# Patient Record
Sex: Female | Born: 1985 | Race: White | Hispanic: No | Marital: Married | Smoking: Never smoker
Health system: Southern US, Community
[De-identification: ages and names within clinical notes are randomized; demographics above are authoritative.]

## PROBLEM LIST (undated history)

## (undated) DIAGNOSIS — E119 Type 2 diabetes mellitus without complications: Secondary | ICD-10-CM

---

## 2008-11-23 ENCOUNTER — Ambulatory Visit: Payer: Self-pay | Admitting: Endocrinology

## 2008-11-23 DIAGNOSIS — J45909 Unspecified asthma, uncomplicated: Secondary | ICD-10-CM | POA: Insufficient documentation

## 2008-11-23 DIAGNOSIS — E039 Hypothyroidism, unspecified: Secondary | ICD-10-CM | POA: Insufficient documentation

## 2008-11-23 DIAGNOSIS — R011 Cardiac murmur, unspecified: Secondary | ICD-10-CM

## 2008-11-23 DIAGNOSIS — E119 Type 2 diabetes mellitus without complications: Secondary | ICD-10-CM

## 2008-11-23 LAB — CONVERTED CEMR LAB: TSH: 29.25 microintl units/mL — ABNORMAL HIGH (ref 0.35–5.50)

## 2009-02-20 ENCOUNTER — Telehealth: Payer: Self-pay | Admitting: Endocrinology

## 2009-02-22 ENCOUNTER — Ambulatory Visit: Payer: Self-pay | Admitting: Endocrinology

## 2009-04-13 ENCOUNTER — Telehealth: Payer: Self-pay | Admitting: Endocrinology

## 2009-11-28 ENCOUNTER — Ambulatory Visit (HOSPITAL_COMMUNITY): Admission: RE | Admit: 2009-11-28 | Discharge: 2009-11-28 | Payer: Self-pay | Admitting: Obstetrics and Gynecology

## 2012-03-20 ENCOUNTER — Telehealth: Payer: Self-pay

## 2017-02-19 NOTE — Telephone Encounter (Signed)
done

## 2021-05-15 ENCOUNTER — Emergency Department (HOSPITAL_COMMUNITY): Payer: Medicaid Other

## 2021-05-15 ENCOUNTER — Other Ambulatory Visit: Payer: Self-pay

## 2021-05-15 ENCOUNTER — Inpatient Hospital Stay (HOSPITAL_COMMUNITY)
Admission: EM | Admit: 2021-05-15 | Discharge: 2021-05-20 | DRG: 493 | Disposition: A | Discharge status: Home Health | Payer: Medicaid Other | Source: Ambulatory Visit | Attending: Family Medicine | Admitting: Family Medicine

## 2021-05-15 ENCOUNTER — Encounter (HOSPITAL_COMMUNITY): Payer: Self-pay | Admitting: Emergency Medicine

## 2021-05-15 DIAGNOSIS — W1830XA Fall on same level, unspecified, initial encounter: Secondary | ICD-10-CM | POA: Diagnosis present

## 2021-05-15 DIAGNOSIS — S82851A Displaced trimalleolar fracture of right lower leg, initial encounter for closed fracture: Principal | ICD-10-CM | POA: Diagnosis present

## 2021-05-15 DIAGNOSIS — Z20822 Contact with and (suspected) exposure to covid-19: Secondary | ICD-10-CM | POA: Diagnosis present

## 2021-05-15 DIAGNOSIS — Z419 Encounter for procedure for purposes other than remedying health state, unspecified: Secondary | ICD-10-CM

## 2021-05-15 DIAGNOSIS — Z794 Long term (current) use of insulin: Secondary | ICD-10-CM

## 2021-05-15 DIAGNOSIS — S82853A Displaced trimalleolar fracture of unspecified lower leg, initial encounter for closed fracture: Secondary | ICD-10-CM | POA: Diagnosis present

## 2021-05-15 DIAGNOSIS — Z6837 Body mass index (BMI) 37.0-37.9, adult: Secondary | ICD-10-CM

## 2021-05-15 DIAGNOSIS — E1069 Type 1 diabetes mellitus with other specified complication: Secondary | ICD-10-CM

## 2021-05-15 DIAGNOSIS — E1065 Type 1 diabetes mellitus with hyperglycemia: Secondary | ICD-10-CM | POA: Diagnosis present

## 2021-05-15 DIAGNOSIS — E871 Hypo-osmolality and hyponatremia: Secondary | ICD-10-CM

## 2021-05-15 DIAGNOSIS — E039 Hypothyroidism, unspecified: Secondary | ICD-10-CM | POA: Diagnosis present

## 2021-05-15 DIAGNOSIS — J45909 Unspecified asthma, uncomplicated: Secondary | ICD-10-CM | POA: Diagnosis present

## 2021-05-15 DIAGNOSIS — E669 Obesity, unspecified: Secondary | ICD-10-CM | POA: Diagnosis present

## 2021-05-15 DIAGNOSIS — M24071 Loose body in right ankle: Secondary | ICD-10-CM | POA: Diagnosis present

## 2021-05-15 HISTORY — DX: Type 2 diabetes mellitus without complications: E11.9

## 2021-05-15 LAB — CBC
HCT: 38.2 % (ref 36.0–46.0)
Hemoglobin: 12.2 g/dL (ref 12.0–15.0)
MCH: 32 pg (ref 26.0–34.0)
MCHC: 31.9 g/dL (ref 30.0–36.0)
MCV: 100.3 fL — ABNORMAL HIGH (ref 80.0–100.0)
Platelets: 452 10*3/uL — ABNORMAL HIGH (ref 150–400)
RBC: 3.81 MIL/uL — ABNORMAL LOW (ref 3.87–5.11)
RDW: 12.3 % (ref 11.5–15.5)
WBC: 11.6 10*3/uL — ABNORMAL HIGH (ref 4.0–10.5)
nRBC: 0 % (ref 0.0–0.2)

## 2021-05-15 LAB — BASIC METABOLIC PANEL
Anion gap: 10 (ref 5–15)
BUN: 14 mg/dL (ref 6–20)
CO2: 23 mmol/L (ref 22–32)
Calcium: 8.5 mg/dL — ABNORMAL LOW (ref 8.9–10.3)
Chloride: 101 mmol/L (ref 98–111)
Creatinine, Ser: 0.75 mg/dL (ref 0.44–1.00)
GFR, Estimated: >60 mL/min (ref >60)
Glucose, Bld: 258 mg/dL — ABNORMAL HIGH (ref 70–99)
Potassium: 5 mmol/L (ref 3.5–5.1)
Sodium: 134 mmol/L — ABNORMAL LOW (ref 135–145)

## 2021-05-15 LAB — I-STAT BETA HCG BLOOD, ED (MC, WL, AP ONLY): I-stat hCG, quantitative: <5 m[IU]/mL (ref <5)

## 2021-05-15 LAB — HEMOGLOBIN A1C
Hgb A1c MFr Bld: 7.9 % — ABNORMAL HIGH (ref 4.8–5.6)
Mean Plasma Glucose: 180.03 mg/dL

## 2021-05-15 LAB — CBG MONITORING, ED: Glucose-Capillary: 215 mg/dL — ABNORMAL HIGH (ref 70–99)

## 2021-05-15 NOTE — ED Triage Notes (Signed)
Pt sent from [facility] walk in clinic for further eval/CT scan of R ankle. They also report that they say her CBG is too high for surgery. DM1 and compliant with insulin, although has not seen PCP in 2 years.

## 2021-05-15 NOTE — ED Provider Triage Note (Signed)
Emergency Medicine Provider Triage Evaluation Note  Debra Howell , a 36 y.o. female  was evaluated in triage.  Pt complains of right ankle pain.  Patient broke her right ankle back last month.  She was initially seen at [facility], but they could not get her the appointment that she needed.  She went to [facility] today, and when she told them the ranges of her normal blood sugars, the orthopedist told her that she needed to be admitted for blood sugar control.  Patient is a known type I diabetic, but has not seen a PCP or an endocrinologist for 2 years.  She says that her blood sugar normally stays in the 100s, but intermittently when she wakes up in the morning is in the 500s.  Review of Systems  Positive: Right ankle pain Negative: Fevers, chills, nausea, vomiting  Physical Exam  BP 133/76 (BP Location: Right Arm)    Pulse (!) 103    Temp 98.5 F (36.9 C) (Oral)    Resp 16    SpO2 99%  Gen:   Awake, no distress   Resp:  Normal effort  MSK:   Moves extremities without difficulty  Other:    Medical Decision Making  Medically screening exam initiated at 4:46 PM.  Appropriate orders placed.  Debra Howell was informed that the remainder of the evaluation will be completed by another provider, this initial triage assessment does not replace that evaluation, and the importance of remaining in the ED until their evaluation is complete.  I explained to patient that we do not admit for solely hyperglycemia, however we will obtain lab work to evaluate the status of her blood sugar and any organ dysfunction.  They are also specifically requesting a CT of the right ankle for preop, as hopefully she should have surgery this weekend.   Debra Bagsby T, PA-C 05/15/21 1655

## 2021-05-16 DIAGNOSIS — E1069 Type 1 diabetes mellitus with other specified complication: Secondary | ICD-10-CM | POA: Insufficient documentation

## 2021-05-16 DIAGNOSIS — E1065 Type 1 diabetes mellitus with hyperglycemia: Secondary | ICD-10-CM | POA: Diagnosis present

## 2021-05-16 DIAGNOSIS — E669 Obesity, unspecified: Secondary | ICD-10-CM | POA: Diagnosis present

## 2021-05-16 DIAGNOSIS — S82853A Displaced trimalleolar fracture of unspecified lower leg, initial encounter for closed fracture: Secondary | ICD-10-CM | POA: Diagnosis present

## 2021-05-16 DIAGNOSIS — Z20822 Contact with and (suspected) exposure to covid-19: Secondary | ICD-10-CM | POA: Diagnosis present

## 2021-05-16 DIAGNOSIS — E871 Hypo-osmolality and hyponatremia: Secondary | ICD-10-CM | POA: Diagnosis present

## 2021-05-16 DIAGNOSIS — Z6837 Body mass index (BMI) 37.0-37.9, adult: Secondary | ICD-10-CM | POA: Diagnosis not present

## 2021-05-16 DIAGNOSIS — Z794 Long term (current) use of insulin: Secondary | ICD-10-CM | POA: Diagnosis not present

## 2021-05-16 DIAGNOSIS — W1830XA Fall on same level, unspecified, initial encounter: Secondary | ICD-10-CM | POA: Diagnosis present

## 2021-05-16 DIAGNOSIS — S82851A Displaced trimalleolar fracture of right lower leg, initial encounter for closed fracture: Secondary | ICD-10-CM | POA: Diagnosis present

## 2021-05-16 DIAGNOSIS — E039 Hypothyroidism, unspecified: Secondary | ICD-10-CM | POA: Diagnosis present

## 2021-05-16 DIAGNOSIS — M24071 Loose body in right ankle: Secondary | ICD-10-CM | POA: Diagnosis present

## 2021-05-16 DIAGNOSIS — J45909 Unspecified asthma, uncomplicated: Secondary | ICD-10-CM | POA: Diagnosis present

## 2021-05-16 DIAGNOSIS — Z419 Encounter for procedure for purposes other than remedying health state, unspecified: Secondary | ICD-10-CM | POA: Diagnosis not present

## 2021-05-16 LAB — CBG MONITORING, ED: Glucose-Capillary: 170 mg/dL — ABNORMAL HIGH (ref 70–99)

## 2021-05-16 LAB — RESP PANEL BY RT-PCR (FLU A&B, COVID) ARPGX2
Influenza A by PCR: NEGATIVE
Influenza B by PCR: NEGATIVE
SARS Coronavirus 2 by RT PCR: NEGATIVE

## 2021-05-16 LAB — GLUCOSE, CAPILLARY
Glucose-Capillary: 167 mg/dL — ABNORMAL HIGH (ref 70–99)
Glucose-Capillary: 141 mg/dL — ABNORMAL HIGH (ref 70–99)

## 2021-05-16 MED ORDER — INSULIN ASPART 100 UNIT/ML IJ SOLN
3.0000 [IU] | Freq: Three times a day (TID) | INTRAMUSCULAR | Status: DC
  Administered 2021-05-16 – 2021-05-18 (×5): 3 [IU] via SUBCUTANEOUS

## 2021-05-16 MED ORDER — INSULIN GLARGINE-YFGN 100 UNIT/ML ~~LOC~~ SOLN
10.0000 [IU] | Freq: Every day | SUBCUTANEOUS | Status: DC
  Filled 2021-05-16: qty 0.1

## 2021-05-16 MED ORDER — POLYETHYLENE GLYCOL 3350 17 G PO PACK
17.0000 g | PACK | Freq: Every day | ORAL | Status: DC
  Administered 2021-05-20: 17 g via ORAL
  Filled 2021-05-16 (×3): qty 1

## 2021-05-16 MED ORDER — ACETAMINOPHEN 325 MG PO TABS
650.0000 mg | ORAL_TABLET | Freq: Four times a day (QID) | ORAL | Status: DC
  Administered 2021-05-16 – 2021-05-20 (×14): 650 mg via ORAL
  Filled 2021-05-16 (×14): qty 2

## 2021-05-16 MED ORDER — ENOXAPARIN SODIUM 40 MG/0.4ML IJ SOSY
40.0000 mg | PREFILLED_SYRINGE | INTRAMUSCULAR | Status: DC
  Administered 2021-05-16 – 2021-05-17 (×2): 40 mg via SUBCUTANEOUS
  Filled 2021-05-16 (×2): qty 0.4

## 2021-05-16 MED ORDER — ACETAMINOPHEN 325 MG PO TABS: 650.0000 mg | ORAL_TABLET | Freq: Four times a day (QID) | ORAL | Status: DC | PRN

## 2021-05-16 MED ORDER — ACETAMINOPHEN 650 MG RE SUPP: 650.0000 mg | Freq: Four times a day (QID) | RECTAL | Status: DC

## 2021-05-16 MED ORDER — INSULIN ASPART 100 UNIT/ML IJ SOLN
0.0000 [IU] | Freq: Three times a day (TID) | INTRAMUSCULAR | Status: DC
  Administered 2021-05-16: 2 [IU] via SUBCUTANEOUS

## 2021-05-16 MED ORDER — OXYCODONE HCL 5 MG PO TABS
5.0000 mg | ORAL_TABLET | ORAL | Status: DC | PRN
  Administered 2021-05-16 – 2021-05-20 (×12): 5 mg via ORAL
  Filled 2021-05-16 (×13): qty 1

## 2021-05-16 MED ORDER — INSULIN ASPART 100 UNIT/ML IJ SOLN
0.0000 [IU] | Freq: Every day | INTRAMUSCULAR | Status: DC
  Administered 2021-05-17: 2 [IU] via SUBCUTANEOUS
  Administered 2021-05-18: 4 [IU] via SUBCUTANEOUS

## 2021-05-16 MED ORDER — INSULIN ASPART 100 UNIT/ML IJ SOLN
0.0000 [IU] | Freq: Three times a day (TID) | INTRAMUSCULAR | Status: DC
  Administered 2021-05-16: 2 [IU] via SUBCUTANEOUS
  Administered 2021-05-17: 9 [IU] via SUBCUTANEOUS
  Administered 2021-05-17: 5 [IU] via SUBCUTANEOUS
  Administered 2021-05-17 – 2021-05-18 (×3): 3 [IU] via SUBCUTANEOUS
  Administered 2021-05-19: 2 [IU] via SUBCUTANEOUS
  Administered 2021-05-19: 3 [IU] via SUBCUTANEOUS
  Administered 2021-05-19: 7 [IU] via SUBCUTANEOUS
  Administered 2021-05-20: 5 [IU] via SUBCUTANEOUS
  Administered 2021-05-20: 7 [IU] via SUBCUTANEOUS

## 2021-05-16 MED ORDER — INSULIN GLARGINE-YFGN 100 UNIT/ML ~~LOC~~ SOLN
14.0000 [IU] | Freq: Every day | SUBCUTANEOUS | Status: DC
  Administered 2021-05-16 – 2021-05-17 (×2): 14 [IU] via SUBCUTANEOUS
  Filled 2021-05-16 (×3): qty 0.14

## 2021-05-16 MED ORDER — ACETAMINOPHEN 650 MG RE SUPP: 650.0000 mg | Freq: Four times a day (QID) | RECTAL | Status: DC | PRN

## 2021-05-16 NOTE — Progress Notes (Signed)
Inpatient Diabetes Program Recommendations  AACE/ADA: New Consensus Statement on Inpatient Glycemic Control (2015)  Target Ranges:  Prepandial:   less than 140 mg/dL      Peak postprandial:   less than 180 mg/dL (1-2 hours)      Critically ill patients:  140 - 180 mg/dL   Lab Results  Component Value Date   GLUCAP 215 (H) 05/15/2021   HGBA1C 7.9 (H) 05/15/2021    Review of Glycemic Control Current CBG 171 per RN  Diabetes history: DM1 (Makes no insulin, needs basal, meal coverage, and correction) Outpatient Diabetes medications: Novolin R 15-25 units tid ac meals Current orders for Inpatient glycemic control: Semglee 10 units q hs, Novolog correction 0-15 units tid + hs 0-5 units  Inpatient Diabetes Program Recommendations:   Spoke with patient via phone. Patient states she has not seen a doctor to order insulin and manage diabetes for some time. Patient buys Novolin R from [place] and doses based on estimation of carbs. Patient was on basal insulin sometime in the past. Noted surgery planned for 05/19/21 on her ankle.  -Increase Semglee to 14 units qd (0.2 units/kg x 71.7 kg = 14 units) -Add Novolog 3 units tid meal coverage if eats 50% -Decrease Novolog correction to 0-9 units tid + hs 0-5 units  Thank you, Darel Hong E. Kohei Antonellis, RN, MSN, CDE  Diabetes Coordinator Inpatient Glycemic Control Team Team Pager [phone] (8am-5pm) 05/16/2021 1:18 PM

## 2021-05-16 NOTE — Plan of Care (Signed)

## 2021-05-16 NOTE — ED Provider Notes (Signed)
[facility] EMERGENCY DEPARTMENT Provider Note   CSN: [id number] Arrival date & time: 05/15/21  1623     History  Chief Complaint  Patient presents with   Ankle Pain   Diabetes    Debra Howell is a 36 y.o. female.  36 year old female presents with right ankle fracture and diabetes.  Patient states that she had a hypoglycemic episode last week and fell injuring her right ankle.  Patient was taken to [facility] and diagnosed with a trimalleolar fracture.  Patient followed up with [facility] Dr. Odis Hollingshead yesterday and was sent to the ER for CT of her ankle and blood sugar management.  Parents blood sugar was reportedly 500 in clinic yesterday, she reports her blood sugar fluctuates greatly and states that around 7:00 today while waiting in the ER lobby she started to feel unwell and noted her blood sugar was 58.  Patient did eat a small snack at that time.  Patient has not been to a primary care provider for 2 years.      Home Medications Prior to Admission medications   Not on File      Allergies    Patient has no known allergies.    Review of Systems   Review of Systems  Constitutional:  Negative for fever.  Respiratory:  Negative for shortness of breath.   Cardiovascular:  Negative for chest pain.  Gastrointestinal:  Negative for abdominal pain.  Musculoskeletal:  Positive for arthralgias and joint swelling.  Skin:  Negative for rash and wound.  Allergic/Immunologic: Positive for immunocompromised state.  Neurological:  Negative for numbness.   Physical Exam Updated Vital Signs BP 107/81 (BP Location: Left Arm)    Pulse (!) 103    Temp 98.5 F (36.9 C) (Oral)    Resp 18    SpO2 99%  Physical Exam Vitals and nursing note reviewed.  Constitutional:      General: She is not in acute distress.    Appearance: She is well-developed. She is not diaphoretic.  HENT:     Head: Normocephalic and atraumatic.  Cardiovascular:     Rate and Rhythm:  Normal rate and regular rhythm.     Heart sounds: Normal heart sounds.  Pulmonary:     Effort: Pulmonary effort is normal.     Breath sounds: Normal breath sounds.  Musculoskeletal:     Comments: Splint in place to right lower extremity, capillary refill and sensation present to each digit.  Skin:    General: Skin is warm and dry.  Neurological:     Mental Status: She is alert and oriented to person, place, and time.  Psychiatric:        Behavior: Behavior normal.    ED Results / Procedures / Treatments   Labs (all labs ordered are listed, but only abnormal results are displayed) Labs Reviewed  BASIC METABOLIC PANEL - Abnormal; Notable for the following components:      Result Value   Sodium 134 (*)    Glucose, Bld 258 (*)    Calcium 8.5 (*)    All other components within normal limits  CBC - Abnormal; Notable for the following components:   WBC 11.6 (*)    RBC 3.81 (*)    MCV 100.3 (*)    Platelets 452 (*)    All other components within normal limits  HEMOGLOBIN A1C - Abnormal; Notable for the following components:   Hgb A1c MFr Bld 7.9 (*)    All other components within normal  limits  CBG MONITORING, ED - Abnormal; Notable for the following components:   Glucose-Capillary 215 (*)    All other components within normal limits  RESP PANEL BY RT-PCR (FLU A&B, COVID) ARPGX2  URINALYSIS, ROUTINE W REFLEX MICROSCOPIC  I-STAT BETA HCG BLOOD, ED ([facility], [facility], [facility] ONLY)  CBG MONITORING, ED    EKG None  Radiology CT Ankle Right Wo Contrast  Result Date: 05/15/2021 CLINICAL DATA:  Recent right ankle injury. EXAM: CT OF THE RIGHT ANKLE WITHOUT CONTRAST TECHNIQUE: Multidetector CT imaging of the right ankle was performed according to the standard protocol. Multiplanar CT image reconstructions were also generated. RADIATION DOSE REDUCTION: This exam was performed according to the departmental dose-optimization program which includes automated exposure control, adjustment of the mA  and/or kV according to patient size and/or use of iterative reconstruction technique. COMPARISON:  Right ankle x-rays dated May 09, 2021. FINDINGS: Bones/Joint/Cartilage Acute minimally displaced fracture of the medial malleolus with 5 mm distraction and 2 mm medial displacement. Acute comminuted oblique fracture of the distal fibular metaphysis with 5 mm lateral displacement. Acute minimally displaced fracture of the posterior malleolus with 3 mm lateral displacement. Mild lateral subluxation of the talar dome with respect to the tibial plafond. No dislocation. The ankle mortise is symmetric. The talar dome is intact. Joint spaces are preserved. Small tibiotalar joint effusion. Ligaments Ligaments are suboptimally evaluated by CT. Muscles and Tendons Grossly intact. Soft tissue Bimalleolar soft tissue swelling. No fluid collection or hematoma. No soft tissue mass. IMPRESSION: 1. Acute trimalleolar fracture as described above. Electronically Signed   By: Obie Dredge M.D.   On: 05/15/2021 18:19    Procedures Procedures    Medications Ordered in ED Medications - No data to display  ED Course/ Medical Decision Making/ A&P Clinical Course as of 05/16/21 1057  Wed May 16, 2021  2441 36 year old female with complaint of fluctuating blood sugars now with trimalleolar right ankle fracture requiring surgery and management of her diabetes. Case discussed with Earney Hamburg, PA-C with orthopedics who has spoken with patient's surgeon and request patient admitted to the hospital for blood sugar management prior to surgery planned for this weekend. Labs reviewed, hemoglobin A1c is 7.9.  hCG negative.  CBG on arrival of 215. CT ankle complete showing acute trimalleolar fracture of the right ankle.  Patient is currently in a posterior OCL with stirrup.  States that the splint was removed by the orthopedic in the office yesterday and her skin appeared to be doing well, denies reports of fracture blister.   She does have cap refill and sensation intact in each digit of this foot. [LM]  1052 Discussed with family medicine service who will consult for admission.  [LM]    Clinical Course User Index [LM] Jeannie Fend, PA-C                           Medical Decision Making Risk Decision regarding hospitalization.           Final Clinical Impression(s) / ED Diagnoses Final diagnoses:  Closed trimalleolar fracture of right ankle, initial encounter  Type 1 diabetes mellitus with other specified complication [facility])    Rx / DC Orders ED Discharge Orders     None         Jeannie Fend, PA-C 05/16/21 1057    Kommor, [place], MD 05/16/21 1538

## 2021-05-16 NOTE — H&P (Signed)
[facility] Admission History and Physical Service Pager: [phone]  Patient name: Debra Howell Medical record number: [id number] Date of birth: 07-24-1985 Age: 36 y.o. Gender: female  Primary Care Provider: Pcp, No Consultants: Ortho Code Status: FULL CODE  Preferred Emergency Contact: Debra Howell:[phone]  Chief Complaint: Ankle pain, hypoglycemia  Assessment and Plan: Debra Howell is a 36 y.o. female presenting with acute trimalleolar fracture that occurred after fall from standing 1 week ago, reports having hypoglycemia at the time. PMH is significant for Type 1 DM.   Trimalleolar Fracture s/p fall from standing  Patient reports that 1 week ago she had hypoglycemic episode and fell.  BG in 50s at the time- she has had intermittent episodes of hypoglycemia. She self-manages her insulin and is only on SSI. She denies any chest pain, or other prodromal symptoms that would make me concerned for cardiac etiology. Patient had outpatient follow-up with [facility] yesterday, reportedly blood glucose was 500 at the time.  She was recommended to come to the ED for CT of her ankle and management of sugars.  CT in ED shows acute trimalleolar fracture of the right ankle. On examination her right leg/ankle are in splint with ACE bandage. Low concern for compartment syndrome given neurovascularly intact. Debra Gunner PA-C with Ortho contacted by ED provider and plan is for surgery on Saturday, 2/4. Admission to [facility] service given her fluctuating glucose readings. -Admit to Med-Surg, attending Dr. Thompson Grayer  -Ortho consulted, plan for surgery on 2/4 -Pain control with 650 mg Tylenol scheduled q6h, 5 mg Oxy IR q4h PRN -PT/OT after surgery  -Lovenox for VTE ppx; will need to stop prior to 2/4 -NPO at midnight 2/4  Type 1 DM  Hx Hypoglycemia   Hemoglobin A1c 7.9.  CBG on arrival to ED 215. She reports a CBG of 48 while in the waiting room and reports she needed a snack.  Given her weight of 71 kg, her estimated TDI dose is 35.5U, about 17U basal and 17U bolus (divided TID with meals).  -Will start Semglee 10U QHS given previous hypoglycemic episodes, room to increase as needed -mSSI with HS coverage -Consult to Diabetes coordinator  -CBG AC and QHS -Carb modified diet   PCP Needs -Consult to TOC   FEN/GI: Carb modified diet  Prophylaxis: Lovenox   Disposition: Med-Surg  History of Present Illness:  Debra Howell is a 36 y.o. female presenting with fall from standing/hypoglycemic episode 1 week ago with right ankle pain.  A week from today, 1/25, she had a low sugar "I think in the 50s". She was walking to the refrigerator and "I didn't make it there". She doesn't think she was down for very long- states her boyfriend heard the "thud" and came to get here. She is unsure if is passed, out or tripped on the wood. She thinks it is possible she may have tripped. She has had history of hypoglycemia in the past (20 was the lowest, a couple years ago).   She was unable to stand following the fall. Had EMS take her to [facility]. States they gave her pain medication, gave her a shot and wrapped her foot. Saw [facility] yesterday- was told that the "skin looked good" and that she needed surgery.   For pain control she has been taking Percocet as needed - has been taking about twice a day. It seems to "numb the screaming".   Checks her glucose in the morning, fasting and every time before she  eats. States her normal fasting sugars are in the 500s. Said she had a sugar of 48 this AM because she didn't eat supper last night.  She uses a sliding scale for her diabetes. Insulin varies between 15-25U throughout the day- only using fast acting. Does not have an Endocrinologist. Hasn't seen PCP in a couple years- states he is no longer practicing.   Last BM 2 days ago.   Humalog is only daily medication. Last took it at 0930AM, took 25U.  Biggest meal is dinner.  Pricks finger for glucose checks.   Review Of Systems: Per HPI with the following additions:   Review of Systems  Constitutional:  Negative for fever.  HENT:  Negative for congestion and rhinorrhea.   Eyes:  Negative for visual disturbance.  Respiratory:  Negative for shortness of breath.   Cardiovascular:  Negative for chest pain, palpitations and leg swelling.  Gastrointestinal:  Negative for abdominal pain, constipation, diarrhea, nausea and vomiting.  Genitourinary:  Negative for dysuria and frequency.  Musculoskeletal:  Positive for arthralgias and myalgias.  Neurological:  Negative for dizziness, weakness, light-headedness, numbness and headaches.       +tingling    Patient Active Problem List   Diagnosis Date Noted   UNSPECIFIED HYPOTHYROIDISM 11/23/2008   DIABETES-TYPE 2 11/23/2008   ASTHMA 11/23/2008   HEART MURMUR 11/23/2008    Past Medical History: Past Medical History:  Diagnosis Date   Diabetes mellitus without complication ([place])     Past Surgical History: Reports remote fracture of finger in left hand-unable to specify.   Social History:   Additional social history: Lives at home with boyfriend and two daughters (3 and 77). Stay at home mom. Rarely drinks- only certain holidays. No tobacco or illicit drug use.   Please also refer to relevant sections of EMR.  Family History: Uncertain Family History. Believes father had MI in 65s-50s. Had HTN.  No FMHx of cancer.   Allergies and Medications: No Known Allergies No current facility-administered medications on file prior to encounter.   No current outpatient medications on file prior to encounter.    Objective: BP 124/77 (BP Location: Right Arm)    Pulse 100    Temp 98.5 F (36.9 C) (Oral)    Resp 16    SpO2 98%  Exam: General: Obese, no acute distress Eyes: EOMI, sclera anicteric  ENTM: MMM, nares patent Neck: Supple Cardiovascular: RRR, without murmur, 2+DP and radial pulses b/l Respiratory: CTAB  without wheezing/rhonchi/rales Gastrointestinal: Obese, soft, non-tender in all quadrants, non-distended MSK: Right leg/ankle in splint and ACE bandage  Derm: Cooler extremities but equal bilaterally with good cap refill Neuro: Normal sensation b/l lower extremities, decreased active ROM of right leg/ankle 2/2 splint and fracture Psych: Slowed speech, normal affect   Labs and Imaging: CBC BMET  Recent Labs  Lab 05/15/21 1714  WBC 11.6*  HGB 12.2  HCT 38.2  PLT 452*   Recent Labs  Lab 05/15/21 1714  NA 134*  K 5.0  CL 101  CO2 23  BUN 14  CREATININE 0.75  GLUCOSE 258*  CALCIUM 8.5*     EKG: Pending  CT Ankle Right Wo Contrast  Result Date: 05/15/2021 CLINICAL DATA:  Recent right ankle injury. EXAM: CT OF THE RIGHT ANKLE WITHOUT CONTRAST TECHNIQUE: Multidetector CT imaging of the right ankle was performed according to the standard protocol. Multiplanar CT image reconstructions were also generated. RADIATION DOSE REDUCTION: This exam was performed according to the departmental dose-optimization program which includes automated  exposure control, adjustment of the mA and/or kV according to patient size and/or use of iterative reconstruction technique. COMPARISON:  Right ankle x-rays dated May 09, 2021. FINDINGS: Bones/Joint/Cartilage Acute minimally displaced fracture of the medial malleolus with 5 mm distraction and 2 mm medial displacement. Acute comminuted oblique fracture of the distal fibular metaphysis with 5 mm lateral displacement. Acute minimally displaced fracture of the posterior malleolus with 3 mm lateral displacement. Mild lateral subluxation of the talar dome with respect to the tibial plafond. No dislocation. The ankle mortise is symmetric. The talar dome is intact. Joint spaces are preserved. Small tibiotalar joint effusion. Ligaments Ligaments are suboptimally evaluated by CT. Muscles and Tendons Grossly intact. Soft tissue Bimalleolar soft tissue swelling. No fluid  collection or hematoma. No soft tissue mass. IMPRESSION: 1. Acute trimalleolar fracture as described above. Electronically Signed   By: Titus Dubin M.D.   On: 05/15/2021 18:19     Sharion Settler, DO 05/16/2021, 10:25 AM PGY-2, [place] Intern pager: [phone], text pages welcome

## 2021-05-16 NOTE — Consult Note (Signed)
Reason for Consult:Right ankle fx Referring Physician: Debe Coder Howell Time called: [id number] Time at bedside: Debra Howell is an 36 y.o. female.  HPI: Debra Howell suffered a hypoglycemic event and lost consciousness and fell. When she came to she had immediate right ankle pain and could not bear weight. She was seen at [facility] and splinted and referred to [facility]. She reported CBG's in the 500's at times and was referred to the ED for glucose stabilization prior to surgery, planned Saturday.   Past Medical History:  Diagnosis Date   Diabetes mellitus without complication ([name])     No family history on file.  Social History:  has no history on file for tobacco use, alcohol use, and drug use.  Allergies: No Known Allergies  Medications: I have reviewed the patient's current medications.  Results for orders placed or performed during the hospital encounter of 05/15/21 (from the past 48 hour(s))  CBG monitoring, ED     Status: Abnormal   Collection Time: 05/15/21  4:56 PM  Result Value Ref Range   Glucose-Capillary 215 (H) 70 - 99 mg/dL    Comment: Glucose reference range applies only to samples taken after fasting for at least 8 hours.  Basic metabolic panel     Status: Abnormal   Collection Time: 05/15/21  5:14 PM  Result Value Ref Range   Sodium 134 (L) 135 - 145 mmol/L   Potassium 5.0 3.5 - 5.1 mmol/L    Comment: HEMOLYSIS AT THIS LEVEL MAY AFFECT RESULT   Chloride 101 98 - 111 mmol/L   CO2 23 22 - 32 mmol/L   Glucose, Bld 258 (H) 70 - 99 mg/dL    Comment: Glucose reference range applies only to samples taken after fasting for at least 8 hours.   BUN 14 6 - 20 mg/dL   Creatinine, Ser 0.75 0.44 - 1.00 mg/dL   Calcium 8.5 (L) 8.9 - 10.3 mg/dL   GFR, Estimated >60 >60 mL/min    Comment: (NOTE) Calculated using the CKD-EPI Creatinine Equation (2021)    Anion gap 10 5 - 15    Comment: Performed at [facility] [address], [place], [place] [address]  CBC      Status: Abnormal   Collection Time: 05/15/21  5:14 PM  Result Value Ref Range   WBC 11.6 (H) 4.0 - 10.5 K/uL   RBC 3.81 (L) 3.87 - 5.11 MIL/uL   Hemoglobin 12.2 12.0 - 15.0 g/dL   HCT 38.2 36.0 - 46.0 %   MCV 100.3 (H) 80.0 - 100.0 fL   MCH 32.0 26.0 - 34.0 pg   MCHC 31.9 30.0 - 36.0 g/dL   RDW 12.3 11.5 - 15.5 %   Platelets 452 (H) 150 - 400 K/uL   nRBC 0.0 0.0 - 0.2 %    Comment: Performed at [facility], [place] [address] [place],  [address]  Hemoglobin A1c     Status: Abnormal   Collection Time: 05/15/21  5:14 PM  Result Value Ref Range   Hgb A1c MFr Bld 7.9 (H) 4.8 - 5.6 %    Comment: (NOTE) Pre diabetes:          5.7%-6.4%  Diabetes:              >6.4%  Glycemic control for   <7.0% adults with diabetes    Mean Plasma Glucose 180.03 mg/dL    Comment: Performed at [facility],  [place] [address]  I-Stat beta hCG blood, ED     Status: None   Collection Time: 05/15/21  5:26 PM  Result Value Ref Range   I-stat hCG, quantitative <5.0 <5 mIU/mL   Comment 3            Comment:   GEST. AGE      CONC.  (mIU/mL)   <=1 WEEK        5 - 50     2 WEEKS       50 - 500     3 WEEKS       100 - 10,000     4 WEEKS     1,000 - 30,000        FEMALE AND NON-PREGNANT FEMALE:     LESS THAN 5 mIU/mL     CT Ankle Right Wo Contrast  Result Date: 05/15/2021 CLINICAL DATA:  Recent right ankle injury. EXAM: CT OF THE RIGHT ANKLE WITHOUT CONTRAST TECHNIQUE: Multidetector CT imaging of the right ankle was performed according to the standard protocol. Multiplanar CT image reconstructions were also generated. RADIATION DOSE REDUCTION: This exam was performed according to the departmental dose-optimization program which includes automated exposure control, adjustment of the mA and/or kV according to patient size and/or use of iterative reconstruction technique. COMPARISON:  Right ankle x-rays dated May 09, 2021. FINDINGS: Bones/Joint/Cartilage Acute  minimally displaced fracture of the medial malleolus with 5 mm distraction and 2 mm medial displacement. Acute comminuted oblique fracture of the distal fibular metaphysis with 5 mm lateral displacement. Acute minimally displaced fracture of the posterior malleolus with 3 mm lateral displacement. Mild lateral subluxation of the talar dome with respect to the tibial plafond. No dislocation. The ankle mortise is symmetric. The talar dome is intact. Joint spaces are preserved. Small tibiotalar joint effusion. Ligaments Ligaments are suboptimally evaluated by CT. Muscles and Tendons Grossly intact. Soft tissue Bimalleolar soft tissue swelling. No fluid collection or hematoma. No soft tissue mass. IMPRESSION: 1. Acute trimalleolar fracture as described above. Electronically Signed   By: Titus Dubin M.D.   On: 05/15/2021 18:19    Review of Systems  HENT:  Negative for ear discharge, ear pain, hearing loss and tinnitus.   Eyes:  Negative for photophobia and pain.  Respiratory:  Negative for cough and shortness of breath.   Cardiovascular:  Negative for chest pain.  Gastrointestinal:  Negative for abdominal pain, nausea and vomiting.  Genitourinary:  Negative for dysuria, flank pain, frequency and urgency.  Musculoskeletal:  Positive for arthralgias (Right ankle). Negative for back pain, myalgias and neck pain.  Neurological:  Negative for dizziness and headaches.  Hematological:  Does not bruise/bleed easily.  Psychiatric/Behavioral:  The patient is not nervous/anxious.   Blood pressure 107/81, pulse (!) 103, temperature 98.5 F (36.9 C), temperature source Oral, resp. rate 18, SpO2 99 %. Physical Exam Constitutional:      General: She is not in acute distress.    Appearance: She is well-developed. She is not diaphoretic.  HENT:     Head: Normocephalic and atraumatic.  Eyes:     General: No scleral icterus.       Right eye: No discharge.        Left eye: No discharge.     Conjunctiva/sclera:  Conjunctivae normal.  Cardiovascular:     Rate and Rhythm: Normal rate and regular rhythm.  Pulmonary:     Effort: Pulmonary effort is normal. No respiratory distress.  Musculoskeletal:     Cervical back:  Normal range of motion.     Comments: RLE No traumatic wounds, ecchymosis, or rash  Short leg splint in place  No knee effusion  Knee stable to varus/ valgus and anterior/posterior stress  Sens DPN, SPN, TN intact  Motor EHL 5/5  Toes perfused, No significant edema  Skin:    General: Skin is warm and dry.  Neurological:     Mental Status: She is alert.  Psychiatric:        Mood and Affect: Mood normal.        Behavior: Behavior normal.    Assessment/Plan: Right ankle fx -- Plan ORIF Saturday with Dr. Kathaleen Bury. Please keep NPO after MN. DM    Lisette Abu, PA-C Orthopedic Surgery [phone] 05/16/2021, 12:07 PM

## 2021-05-16 NOTE — Hospital Course (Addendum)
Debra Howell is a 36 y.o. female who was admitted to the [facility] Medicine Teaching Service at [facility] for an acute fall from standing due to an episode of hypoglycemia. Hospital course is outlined below by system.   Trimalleolar Fracture s/p ground-level fall  Stable, closed fracture after fall from standing that occurred on 1/25. Was seen at [facility] previously. She had outpatient follow up with ortho prior to admission. Was recommended for medical admission prior to surgery given her reported labile blood sugars at home. In the ED, had CT of her ankle which confirmed trimalleolar fracture. Pain was controlled with scheduled tylenol and oxy 5 mg as needed for breakthrough pain. She had ORIF, right ankle syndesmosis fixation x2, right ankle deltoid ligament repair, and right ankle arthrotomy with irrigation and removal of interposed tissue and loose bodies on 2/4 without complication. PT/OT recommendations at time of discharge were ***.   Type 1 DM  Patient presented to the ED after fall due to reported hypoglycemia in 40s at home. She had not been followed by PCP and had been self-managing her diabetes with only basal Humalog. On admission, CBG >200, Hgb A1c 7.9. She was started on basal, bolus and mealtime insulin.  She received extensive diabetes education by the diabetes coordinators. She had no episodes of hypoglycemia while inpatient. She was discharged with prescription for Lantus, Novolog, FreeStyle Libre 2 CGM and reader, insulin pen needles.  Insulin regimen at time of discharge was Semglee 20U, Novolog 6U TID with meals, sensitive SSI (0-9U) TID with meals, HS coverage with Novolog (0-5U).    RESP/CV: The patient remained hemodynamically stable throughout the hospitalization    FEN/GI: Maintenance IV fluids were continued throughout hospitalization. At the time of discharge, the patient was tolerating PO off IV fluids.

## 2021-05-17 ENCOUNTER — Other Ambulatory Visit (HOSPITAL_COMMUNITY): Payer: Self-pay

## 2021-05-17 LAB — CBC
HCT: 39.5 % (ref 36.0–46.0)
Hemoglobin: 13 g/dL (ref 12.0–15.0)
MCH: 32 pg (ref 26.0–34.0)
MCHC: 32.9 g/dL (ref 30.0–36.0)
MCV: 97.3 fL (ref 80.0–100.0)
Platelets: 428 10*3/uL — ABNORMAL HIGH (ref 150–400)
RBC: 4.06 MIL/uL (ref 3.87–5.11)
RDW: 12.3 % (ref 11.5–15.5)
WBC: 9.3 10*3/uL (ref 4.0–10.5)
nRBC: 0 % (ref 0.0–0.2)

## 2021-05-17 LAB — BASIC METABOLIC PANEL
Anion gap: 11 (ref 5–15)
BUN: 9 mg/dL (ref 6–20)
CO2: 21 mmol/L — ABNORMAL LOW (ref 22–32)
Calcium: 8.5 mg/dL — ABNORMAL LOW (ref 8.9–10.3)
Chloride: 100 mmol/L (ref 98–111)
Creatinine, Ser: 0.78 mg/dL (ref 0.44–1.00)
GFR, Estimated: >60 mL/min (ref >60)
Glucose, Bld: 337 mg/dL — ABNORMAL HIGH (ref 70–99)
Potassium: 4.4 mmol/L (ref 3.5–5.1)
Sodium: 132 mmol/L — ABNORMAL LOW (ref 135–145)

## 2021-05-17 LAB — GLUCOSE, CAPILLARY
Glucose-Capillary: 390 mg/dL — ABNORMAL HIGH (ref 70–99)
Glucose-Capillary: 401 mg/dL — ABNORMAL HIGH (ref 70–99)
Glucose-Capillary: 375 mg/dL — ABNORMAL HIGH (ref 70–99)
Glucose-Capillary: 229 mg/dL — ABNORMAL HIGH (ref 70–99)
Glucose-Capillary: 254 mg/dL — ABNORMAL HIGH (ref 70–99)
Glucose-Capillary: 229 mg/dL — ABNORMAL HIGH (ref 70–99)

## 2021-05-17 LAB — HIV ANTIBODY (ROUTINE TESTING W REFLEX): HIV Screen 4th Generation wRfx: NONREACTIVE

## 2021-05-17 NOTE — TOC Benefit Eligibility Note (Signed)
Patient Product/process development scientist completed.    The patient is currently admitted and upon discharge could be taking Lantus Pens.  The current 30 day co-pay is, $0.00.   The patient is currently admitted and upon discharge could be taking Social research officer, government.  Non Formulary  The patient is currently admitted and upon discharge could be taking Semglee Pens.  Non Formulary  The patient is insured through [employer]     Roland Earl, CPhT Pharmacy Patient Advocate Specialist [facility] Patient Advocate Team Direct Number: [phone]  Fax: [phone]

## 2021-05-17 NOTE — Progress Notes (Signed)
FPTS Brief Progress Note  S:Pt sleeping    O: BP 113/82 (BP Location: Left Arm)    Pulse (!) 101    Temp 98.1 F (36.7 C) (Oral)    Resp 16    SpO2 99%    GEN: sleeping comfortably in bed RESP: equal chest rise and fall   A/P: Most recent documented glu 141.  No change to plan. See AM progress note.    Lyndee Hensen, DO 05/17/2021, 2:56 AM PGY-3, [place] [facility] Night Resident  Please page [phone] with questions.

## 2021-05-17 NOTE — Progress Notes (Addendum)
Family Medicine Teaching Service Daily Progress Note Intern Pager: [phone]  Patient name: Debra Howell Medical record number: [id number] Date of birth: [birth year] Age: 36 y.o. Gender: female  Primary Care Provider: Pcp, No Consultants: Ortho Code Status: Full  Pt Overview and Major Events to Date:  2/1 - Admitted  Assessment and Plan: Debra Howell is a 36 year old female presenting with acute trimalleolar fracture that occurred after a fall from standing 1 week ago, reports of hypoglycemia at this time.  Past medical history significant for type 1 diabetes mellitus.  Traimalleolar fracture s/p fall 2/2 hypoglycemic event Pain is somewhat controlled this morning, she reports overnight it was not very well controlled until she was able to get medications. Surgical intervention planned for Saturday 2/4. - Ortho following, surgery 2/4 - Pain control  Tylenol 650mg  Q6H  Oxycodone 6mg  Q4H PRN - Lovenox for VTE held, will resume when appropriate after surgery - NPO at MN  Type 1 DM   H/o hypoglycemia Patient with fasting CBG this morning 237.  Patient received 22 units NovoLog. - Increased Semglee to 15 Units QHS - Sensitive SSI - Increased Meal coverage to Novolog 6 Units - Night coverage: Novolog 0-5 Units - Diabetic coordinator following - CBG qAC and QHS - Carb mod diet with night-time snack  Hyponatremia Sodium 134 on admission, remains stable. Consideration that hyperglycemia could be contributing. Will monitor and trend.  - BMP daily.  FEN/GI: Carb modified PPx: Lovenox Dispo:Pending PT recommendations  in 2-3 days. Barriers include surgical intervention.   Subjective:  Patient reports that overnight she did have some increasing pain in her leg but that there are no other complaints at this time.  Then pain improved whenever she was given her medications.  Objective: Temp:  [97.8 F (36.6 C)-98.7 F (37.1 C)] 98.7 F (37.1 C) (02/03 0606) Pulse Rate:  [61-86] 61  (02/03 0606) Resp:  [17-18] 17 (02/03 0606) BP: (108-114)/(80-86) 108/80 (02/03 0606) SpO2:  [96 %-99 %] 96 % (02/03 0606) Weight:  [99.5 kg] 99.5 kg (02/02 0840) Physical Exam: General: NAD, sitting up in bed Cardiovascular: RRR, no M/R/G Respiratory: Clear to auscultation bilaterally, no increased work of breathing Abdomen: Soft, nontender, nondistended Extremities: Right leg with Ace bandage and splint.  Able to move toes.  Peripheral pulses in left leg, unable to evaluate right due to splinting  Laboratory: Recent Labs  Lab 05/15/21 1714 05/17/21 0454  WBC 11.6* 9.3  HGB 12.2 13.0  HCT 38.2 39.5  PLT 452* 428*   Recent Labs  Lab 05/15/21 1714 05/17/21 0454 05/18/21 0307  NA 134* 132* 134*  K 5.0 4.4 3.9  CL 101 100 101  CO2 23 21* 23  BUN 14 9 12   CREATININE 0.75 0.78 0.66  CALCIUM 8.5* 8.5* 8.5*  GLUCOSE 258* 337* 189*     Imaging/Diagnostic Tests: No results found.   07/15/21, DO 05/18/2021, 7:12 AM PGY-2, [place] [facility] FPTS Intern pager: [phone], text pages welcome

## 2021-05-17 NOTE — Progress Notes (Signed)
Mobility Specialist Progress Note   05/17/21 1530  Mobility  Activity Ambulated with assistance in room  Level of Assistance Contact guard assist, steadying assist  Assistive Device Front wheel walker  RLE Weight Bearing NWB  Distance Ambulated (ft) 16 ft  Activity Response Tolerated well  $Mobility charge 1 Mobility   Received pt in bed c/o R foot pain(6/10) but agreeable. Standby A for bed mobility to EOB and contact G for safety. Ambulated w/ hop gait having no fault or complaint, returned BTB w/ call bell in reach.   Holland Falling Mobility Specialist Phone Number [phone]

## 2021-05-17 NOTE — Progress Notes (Signed)
Subjective:     Patient reports pain as mild to moderate. She is awaiting surgery on Saturday. She has been working with primary team on glycemic optimization.  Objective:   VITALS:  Temp:  [97.8 F (36.6 C)-98.2 F (36.8 C)] 98.2 F (36.8 C) (02/02 2159) Pulse Rate:  [81-91] 81 (02/02 2159) Resp:  [17-18] 17 (02/02 2159) BP: (109-114)/(73-86) 114/86 (02/02 2159) SpO2:  [98 %-100 %] 99 % (02/02 2159) Weight:  [99.5 kg] 99.5 kg (02/02 0840)  Gen: AAOx3, NAD  Right lower extremity: Well padded short leg splint in place Wiggles toes SILT over toes CR<2s    LABS Recent Labs    05/15/21 1714 05/17/21 0454  HGB 12.2 13.0  WBC 11.6* 9.3  PLT 452* 428*   Recent Labs    05/15/21 1714 05/17/21 0454  NA 134* 132*  K 5.0 4.4  CL 101 100  CO2 23 21*  BUN 14 9  CREATININE 0.75 0.78  GLUCOSE 258* 337*   No results for input(s): LABPT, INR in the last 72 hours.   Assessment/Plan: 36 yo F poorly controlled type 1 diabetic who is currently admitted to the Medicine team for preoperative optimization and glycemic control prior to right trimalleolar ankle fracture ORIF scheduled for 05/16/21  She is agreeable to proceed with right ankle ORIF, possible syndesmosis fixation and/or deltoid repair, possible external fixation with delayed definitive fixation on Saturday 05/19/21.   Please keep her NPO from MN on Saturday 0000h and hold VTE prophylaxis for 24 hours prior to Sat morning.  Preoperative orders placed.  Armond Hang 05/17/2021, 11:31 PM

## 2021-05-17 NOTE — Progress Notes (Signed)
FPTS Brief Progress Note  S:Pt reports leg pain. RN just gave her pain medication.    O: BP 114/86 (BP Location: Left Arm)    Pulse 81    Temp 98.2 F (36.8 C) (Oral)    Resp 17    Wt 99.5 kg    SpO2 99%    GEN: drowsy in bed, no acute distress  RESP: speaking in full sentences without pause   A/P: VSS. Tylenol and Oxycodone given about 15 minutes ago. Advised pt to reach out to RN staff if pain does not subside. Await surgical intervention on 2/4.  - Orders reviewed. Labs for AM ordered, which was adjusted as needed.    Katha Cabal, DO 05/17/2021, 11:12 PM PGY-3,  Family Medicine Night Resident  Please page [phone] with questions.

## 2021-05-17 NOTE — Progress Notes (Signed)
Family Medicine Teaching Service Daily Progress Note Intern Pager: [phone]  Patient name: Debra Howell Medical record number: [id number] Date of birth: 1985-04-20 Age: 36 y.o. Gender: female  Primary Care Provider: Pcp, No Consultants: Ortho Code Status: Full Code  Pt Overview and Major Events to Date:  05/16/21: Admitted to FPTS   Assessment and Plan:  Debra Howell is a 36 year old female presenting with acute trimalleolar fracture that occurred after a fall from standing 1 week ago, reports of hypoglycemia at this time.  Past medical history significant for type 1 diabetes mellitus.  Trimalleolar fracture status post fall from standing due to hypoglycemic event Patient has acute trimalleolar fracture of the right ankle that is currently in a splint with an Ace bandage.  Pain today is fairly well controlled.  Plan for surgical intervention with Ortho on Saturday, 2/4, will be n.p.o. on Saturday at midnight.  Last Oxy dose was at 2300 on 2/1.  No current evidence of leukocytosis or fever to indicate suspicion for infection.  We will continue to monitor. - Ortho consulted, plan for surgery on 2/4 - Pain control with 650 MG Tylenol scheduled every 6, oxycodone every 5 mg as needed q4hr -PT/OT after surgery - Lovenox for VTE prophylaxis, stopping this prior to 2/4 (24 hours) -- NPO @ MN 2/4   DM1   H/o hypoglycemia  A1C 7.9.  CBGs 337 > 390 > 401. Novolog 9 U given around 7AM. Current regimen 14U at bedtime, Novolog 3U with meals, and sSSI.  Patient only used short acting prior to admission.  It appears that she has nocturnal hypoglycemia with a spike in sugars in the a.m.  We will continue with a snack prior to bedtime and check sugar at 2 AM.  Likely change to regimen with consideration of increase of NovoLog with meals. - Consider increasing insulin regimen - Consulted diabetes coordinator, appreciate recommendations - CBG before meals and nightly and 2 AM - Carb modified diet with snack at  night  Consult to [facility] - Eat for PCP needs   FEN/GI: Carb modified  PPx: Lovenox  Dispo:Pending PT recommendations  in 3 or more days. Barriers include surgical intervention to be done on Saturday, 2/4.   Subjective:  Patient is doing well with no new complaints.  She reports that currently her pain is a 6 out of 10 but has not needed Oxy this a.m.  Objective: Temp:  [97.8 F (36.6 C)-98.9 F (37.2 C)] 97.8 F (36.6 C) (02/02 0725) Pulse Rate:  [86-106] 86 (02/02 0725) Resp:  [16-18] 18 (02/02 0725) BP: (107-125)/(73-93) 109/80 (02/02 0725) SpO2:  [98 %-100 %] 98 % (02/02 0725) Physical Exam: General: No acute distress, sitting up in bed, nontoxic Cardiovascular: RRR, no M/R/G Respiratory: Clear to auscultation bilaterally Abdomen: Soft and nondistended Extremities: Right leg covered in Ace bandage and splint.  Able to move toes with good circulation.  Dorsalis pedis pulses palpable in left leg, unable to check in the right leg due to splint and Ace bandage  Laboratory: Recent Labs  Lab 05/15/21 1714 05/17/21 0454  WBC 11.6* 9.3  HGB 12.2 13.0  HCT 38.2 39.5  PLT 452* 428*   Recent Labs  Lab 05/15/21 1714 05/17/21 0454  NA 134* 132*  K 5.0 4.4  CL 101 100  CO2 23 21*  BUN 14 9  CREATININE 0.75 0.78  CALCIUM 8.5* 8.5*  GLUCOSE 258* 337*      Alfredo Martinez, MD 05/17/2021, 7:59 AM PGY-1 [facility] FPTS  Intern pager: [phone], text pages welcome

## 2021-05-17 NOTE — Progress Notes (Addendum)
Inpatient Diabetes Program Recommendations  AACE/ADA: New Consensus Statement on Inpatient Glycemic Control (2015)  Target Ranges:  Prepandial:   less than 140 mg/dL      Peak postprandial:   less than 180 mg/dL (1-2 hours)      Critically ill patients:  140 - 180 mg/dL   Lab Results  Component Value Date   GLUCAP 401 (H) 05/17/2021   HGBA1C 7.9 (H) 05/15/2021    Review of Glycemic Control  Latest Reference Range & Units 05/16/21 17:53 05/16/21 20:19 05/17/21 06:42 05/17/21 07:27  Glucose-Capillary 70 - 99 mg/dL 324 (H) 401 (H) 027 (H) 401 (H)  (H): Data is abnormally high  Diabetes history: DM1(does not make insulin.  Needs correction, basal and meal coverage) Outpatient Diabetes medications: Novoloin R 15-25 units TID Current orders for Inpatient glycemic control: Semglee 14 units QD, Novolog 0-9 units TID and 0-5 qhd, 3 units TID   Spoke with patient at bedside.  A1C is 7.9%.  Her CBG was 140 mg/dL last night at 25:36 and 390 mg/dL this morning.  Asked her about her DM regimen.  She was diagnosed with T1DM at age 27 or 39.  She used to wear a [employer] insulin pump as an adolescent.  She states the pump site started kinking a lot and she would cramp at the site and stopped using the pump.  She now buys Novolin R at [facility] for $25 a vial.  This patient has Medicaid.  She should be able to get basal bolus for $3 each.  When asked why she does not do this, she states she does not like doctor's offices or wearing a mask.  She has not seen a PCP in over 2 years.    I believe she may be having nocturnal hypoglycemia causing rebound hyperglycemia in the morning.  She states her blood sugar is in the low 100's at bedtime and consistently 400-500 mg/dL in the morning.  Explained nocturnal hypoglycemia and how the liver releases glucose in response causing hyperglycemia in the morning.  This would also explain her A1C of 7.9% with consistent CBGs 400-500 mg/dL in the morning.    Please check  2am CBG.  Provide patient with a 15 gram of carb snack.  This patient should follow up with a PCP, use a CGM and discharge on basal bolus.    Will continue to follow while inpatient.  Thank you, Dulce Sellar, MSN, RN Diabetes Coordinator Inpatient Diabetes Program [phone] (team pager from 8a-5p)

## 2021-05-18 ENCOUNTER — Encounter (HOSPITAL_COMMUNITY): Payer: Self-pay | Admitting: Family Medicine

## 2021-05-18 DIAGNOSIS — S82851A Displaced trimalleolar fracture of right lower leg, initial encounter for closed fracture: Principal | ICD-10-CM

## 2021-05-18 DIAGNOSIS — E1069 Type 1 diabetes mellitus with other specified complication: Secondary | ICD-10-CM | POA: Diagnosis not present

## 2021-05-18 LAB — BASIC METABOLIC PANEL
Anion gap: 10 (ref 5–15)
BUN: 12 mg/dL (ref 6–20)
CO2: 23 mmol/L (ref 22–32)
Calcium: 8.5 mg/dL — ABNORMAL LOW (ref 8.9–10.3)
Chloride: 101 mmol/L (ref 98–111)
Creatinine, Ser: 0.66 mg/dL (ref 0.44–1.00)
GFR, Estimated: >60 mL/min (ref >60)
Glucose, Bld: 189 mg/dL — ABNORMAL HIGH (ref 70–99)
Potassium: 3.9 mmol/L (ref 3.5–5.1)
Sodium: 134 mmol/L — ABNORMAL LOW (ref 135–145)

## 2021-05-18 LAB — GLUCOSE, CAPILLARY
Glucose-Capillary: 196 mg/dL — ABNORMAL HIGH (ref 70–99)
Glucose-Capillary: 237 mg/dL — ABNORMAL HIGH (ref 70–99)
Glucose-Capillary: 221 mg/dL — ABNORMAL HIGH (ref 70–99)
Glucose-Capillary: 115 mg/dL — ABNORMAL HIGH (ref 70–99)
Glucose-Capillary: 331 mg/dL — ABNORMAL HIGH (ref 70–99)

## 2021-05-18 LAB — SURGICAL PCR SCREEN
MRSA, PCR: NEGATIVE
Staphylococcus aureus: NEGATIVE

## 2021-05-18 MED ORDER — INSULIN ASPART 100 UNIT/ML IJ SOLN
6.0000 [IU] | Freq: Three times a day (TID) | INTRAMUSCULAR | Status: DC
  Administered 2021-05-18 – 2021-05-20 (×4): 6 [IU] via SUBCUTANEOUS

## 2021-05-18 MED ORDER — INSULIN GLARGINE-YFGN 100 UNIT/ML ~~LOC~~ SOLN
15.0000 [IU] | Freq: Every day | SUBCUTANEOUS | Status: DC
  Administered 2021-05-18: 15 [IU] via SUBCUTANEOUS
  Filled 2021-05-18 (×2): qty 0.15

## 2021-05-18 NOTE — Progress Notes (Signed)
Pt spouse verbally agressive to nursing staff.  Pt and spouse frustrated because their children are not allowed to visit due to hospital policy and seasonal restrictions.  Confirmed with floor supervisor and hospital supervisor that there will be no exceptions and children are unable to visit at this time.  NT and RN able to provide education to pt and spouse.  Pt and spouse verbalized understanding, however, they are still frustrated by situation.  Will continue to monitor.

## 2021-05-18 NOTE — Progress Notes (Addendum)
Inpatient Diabetes Program Recommendations  AACE/ADA: New Consensus Statement on Inpatient Glycemic Control   Target Ranges:  Prepandial:   less than 140 mg/dL      Peak postprandial:   less than 180 mg/dL (1-2 hours)      Critically ill patients:  140 - 180 mg/dL     Latest Reference Range & Units 05/17/21 07:27 05/17/21 08:43 05/17/21 11:31 05/17/21 15:50 05/17/21 21:59 05/18/21 02:37  Glucose-Capillary 70 - 99 mg/dL 696 (H) 789 (H) 381 (H) 254 (H) 229 (H) 196 (H)   Review of Glycemic Control  Diabetes history: DM1; makes NO insulin, requires basal, correction, and meal coverage insulin Outpatient Diabetes medications: Regular 15-25 units TID with meals Current orders for Inpatient glycemic control: Semglee 14 units QHS, Novolog 0-9  units TID with meals, Novolog 0-5 units QHS, Novolog 3 units TID with meals  Inpatient Diabetes Program Recommendations:    Insulin: Please consider increasing Semglee to 15 units QHS and meal coverage to Novolog 6 units TID with meals.  Outpatient DM medications: At time of discharge, please consider discharging on basal, meal coverage, and correction insulin. Please provide Rx for Lantus SoloStar pens [phone]), Novolog Flexpen [phone]), insulin pen needles [phone]), FreeStyle Libre2 reader [phone]), FreeStyle Libre2 sensors [phone]).   Addendum 05/18/21@11 :40-Received communication from Dr. Marisue Humble regarding FreeStyle Libre2 CGM. Verbal order given for inpatient diabetes coordinator to provide samples of FreeStyle Libre2 sensors to patient. Patient states that she has used a Medtronic CGM sensor with an insulin pump in the past. Patient states she has not ever used FreeStyle Libre2 CGM sensor but would like to use outpatient. Patient reports that she was frequently having hypoglycemia during the night and then consistently elevated glucoses upon awakening in the morning. Patient patient with FreeStyle Libre2 CGM sensor samples (3).  Encouraged patient to  watch youtube video on how to apply FreeStyle Libre2 and also review information packet provided which also has written instructions with how to apply.  Reviewed how to apply and use FreeStyle Libre2 CGM. Discussed using alarms on FreeStyle Libre2 (default set at less than 70 mg/dl and over 867 mg/dl).  Patient states she has an older phone and not sure if she can download the [employer] app or not; therefore, will ask that patient be given Rx for FreeStyle Libre2 reader device at discharge. Patient states she has used vial/syringe and insulin pens in the past and would prefer to use insulin pens. Patient confirms that she is getting Regular insulin at [facility] over the counter and using it for all insulin needs. Discussed how Regular insulin works (onset 30 mins, peaks in 2-4 hours, and duration of 6-8 hours).  Discussed that since she has Type 1 DM, it would be more beneficial for her to be on basal, meal coverage, and correction insulin. Discussed Lantus and Novolog insulin.  Patient is willing to take Lantus and Novolog insulin if prescribed at discharge. Patient states that she would like to go to [facility] Medicine Teaching Service for primary care and consistently follow up. Encouraged patient to apply FreeStyle Libre2 CGM after discharge and take insulin as prescribed. Stressed importance of maintaining good DM control to decrease risk of further complications. Patient verbalized understanding of information and states she has no questions at this time.   Thanks, Orlando Penner, RN, MSN, CDE Diabetes Coordinator Inpatient Diabetes Program [phone] (Team Pager from 8am to 5pm)

## 2021-05-18 NOTE — Progress Notes (Signed)
RN spoke with pt about frustrations - pt upset because her children (age 36 and 3) were unable to visit today, although they were told by a MD that they could visit.  Pt sad and anxious because her spouse passed away from a stroke in 2020-07-09, and their children, specifically the 59 yo, were unable to visit with their dad prior to his passing.  The visitor today was the pts boyfriend and has been caring for her children during the hospitalization.    Pt frustrated because she had to wait for 18 hours in the ED prior to being admitted, her children were brought up here today thinking they could visit and then were turned away, and the pt feels that no one cares.    Pt tearful during conversation.  Emotional support provided throughout conversation.

## 2021-05-19 ENCOUNTER — Inpatient Hospital Stay (HOSPITAL_COMMUNITY): Payer: Medicaid Other | Admitting: Anesthesiology

## 2021-05-19 ENCOUNTER — Encounter (HOSPITAL_COMMUNITY)
Admission: EM | Disposition: A | Discharge status: Home Health | Payer: Self-pay | Source: Ambulatory Visit | Attending: Family Medicine

## 2021-05-19 ENCOUNTER — Ambulatory Visit (HOSPITAL_COMMUNITY): Admission: RE | Admit: 2021-05-19 | Payer: Medicaid Other | Source: Home / Self Care | Admitting: Orthopaedic Surgery

## 2021-05-19 ENCOUNTER — Encounter (HOSPITAL_COMMUNITY): Payer: Self-pay | Admitting: Family Medicine

## 2021-05-19 ENCOUNTER — Inpatient Hospital Stay (HOSPITAL_COMMUNITY): Payer: Medicaid Other

## 2021-05-19 ENCOUNTER — Other Ambulatory Visit: Payer: Self-pay

## 2021-05-19 DIAGNOSIS — E871 Hypo-osmolality and hyponatremia: Secondary | ICD-10-CM

## 2021-05-19 DIAGNOSIS — Z419 Encounter for procedure for purposes other than remedying health state, unspecified: Secondary | ICD-10-CM

## 2021-05-19 HISTORY — PX: ORIF ANKLE FRACTURE: SHX5408

## 2021-05-19 LAB — CBC
HCT: 39.1 % (ref 36.0–46.0)
Hemoglobin: 12.6 g/dL (ref 12.0–15.0)
MCH: 31.3 pg (ref 26.0–34.0)
MCHC: 32.2 g/dL (ref 30.0–36.0)
MCV: 97.3 fL (ref 80.0–100.0)
Platelets: 526 10*3/uL — ABNORMAL HIGH (ref 150–400)
RBC: 4.02 MIL/uL (ref 3.87–5.11)
RDW: 12.3 % (ref 11.5–15.5)
WBC: 10.8 10*3/uL — ABNORMAL HIGH (ref 4.0–10.5)
nRBC: 0 % (ref 0.0–0.2)

## 2021-05-19 LAB — GLUCOSE, CAPILLARY
Glucose-Capillary: 189 mg/dL — ABNORMAL HIGH (ref 70–99)
Glucose-Capillary: 294 mg/dL — ABNORMAL HIGH (ref 70–99)
Glucose-Capillary: 328 mg/dL — ABNORMAL HIGH (ref 70–99)
Glucose-Capillary: 223 mg/dL — ABNORMAL HIGH (ref 70–99)
Glucose-Capillary: 267 mg/dL — ABNORMAL HIGH (ref 70–99)
Glucose-Capillary: 179 mg/dL — ABNORMAL HIGH (ref 70–99)
Glucose-Capillary: 215 mg/dL — ABNORMAL HIGH (ref 70–99)
Glucose-Capillary: 317 mg/dL — ABNORMAL HIGH (ref 70–99)
Glucose-Capillary: 200 mg/dL — ABNORMAL HIGH (ref 70–99)

## 2021-05-19 LAB — BASIC METABOLIC PANEL
Anion gap: 12 (ref 5–15)
BUN: 11 mg/dL (ref 6–20)
CO2: 24 mmol/L (ref 22–32)
Calcium: 8.9 mg/dL (ref 8.9–10.3)
Chloride: 97 mmol/L — ABNORMAL LOW (ref 98–111)
Creatinine, Ser: 0.74 mg/dL (ref 0.44–1.00)
GFR, Estimated: >60 mL/min (ref >60)
Glucose, Bld: 298 mg/dL — ABNORMAL HIGH (ref 70–99)
Potassium: 4 mmol/L (ref 3.5–5.1)
Sodium: 133 mmol/L — ABNORMAL LOW (ref 135–145)

## 2021-05-19 SURGERY — OPEN REDUCTION INTERNAL FIXATION (ORIF) ANKLE FRACTURE
Anesthesia: General | Site: Ankle | Laterality: Right

## 2021-05-19 MED ORDER — SODIUM CHLORIDE 0.9 % IR SOLN
Status: DC | PRN
  Administered 2021-05-19: 1000 mL

## 2021-05-19 MED ORDER — CHLORHEXIDINE GLUCONATE 0.12 % MT SOLN
OROMUCOSAL | Status: AC
  Administered 2021-05-19: 15 mL via OROMUCOSAL
  Filled 2021-05-19: qty 15

## 2021-05-19 MED ORDER — OXYCODONE HCL 5 MG PO TABS: 5.0000 mg | ORAL_TABLET | Freq: Once | ORAL | Status: DC | PRN

## 2021-05-19 MED ORDER — ACETAMINOPHEN 10 MG/ML IV SOLN
1000.0000 mg | Freq: Once | INTRAVENOUS | Status: DC | PRN
  Administered 2021-05-19: 1000 mg via INTRAVENOUS

## 2021-05-19 MED ORDER — VANCOMYCIN HCL 1000 MG IV SOLR
INTRAVENOUS | Status: DC | PRN
  Administered 2021-05-19: 500 mg via TOPICAL

## 2021-05-19 MED ORDER — PROPOFOL 10 MG/ML IV BOLUS
INTRAVENOUS | Status: DC | PRN
Start: 2021-05-19 — End: 2021-05-19
  Administered 2021-05-19: 200 mg via INTRAVENOUS

## 2021-05-19 MED ORDER — OXYCODONE HCL 5 MG/5ML PO SOLN: 5.0000 mg | Freq: Once | ORAL | Status: DC | PRN

## 2021-05-19 MED ORDER — FENTANYL CITRATE (PF) 250 MCG/5ML IJ SOLN
INTRAMUSCULAR | Status: DC | PRN
  Administered 2021-05-19: 50 ug via INTRAVENOUS
  Administered 2021-05-19 (×4): 25 ug via INTRAVENOUS

## 2021-05-19 MED ORDER — FENTANYL CITRATE (PF) 100 MCG/2ML IJ SOLN
25.0000 ug | INTRAMUSCULAR | Status: DC | PRN
  Administered 2021-05-19 (×2): 25 ug via INTRAVENOUS
  Administered 2021-05-19: 50 ug via INTRAVENOUS
  Administered 2021-05-19 (×2): 25 ug via INTRAVENOUS

## 2021-05-19 MED ORDER — SCOPOLAMINE 1 MG/3DAYS TD PT72
1.0000 | MEDICATED_PATCH | TRANSDERMAL | Status: DC
  Administered 2021-05-19: 1.5 mg via TRANSDERMAL
  Filled 2021-05-19: qty 1

## 2021-05-19 MED ORDER — LACTATED RINGERS IV SOLN: INTRAVENOUS | Status: DC

## 2021-05-19 MED ORDER — CEFAZOLIN SODIUM-DEXTROSE 2-4 GM/100ML-% IV SOLN
2.0000 g | INTRAVENOUS | Status: AC
  Administered 2021-05-19: 2 g via INTRAVENOUS
  Filled 2021-05-19: qty 100

## 2021-05-19 MED ORDER — MIDAZOLAM HCL 2 MG/2ML IJ SOLN
INTRAMUSCULAR | Status: AC
  Filled 2021-05-19: qty 2

## 2021-05-19 MED ORDER — ONDANSETRON HCL 4 MG/2ML IJ SOLN
INTRAMUSCULAR | Status: DC | PRN
Start: 2021-05-19 — End: 2021-05-19
  Administered 2021-05-19: 4 mg via INTRAVENOUS

## 2021-05-19 MED ORDER — BUPIVACAINE-EPINEPHRINE (PF) 0.5% -1:200000 IJ SOLN
INTRAMUSCULAR | Status: DC | PRN
  Administered 2021-05-19: 30 mL via PERINEURAL

## 2021-05-19 MED ORDER — FENTANYL CITRATE (PF) 100 MCG/2ML IJ SOLN
INTRAMUSCULAR | Status: AC
  Filled 2021-05-19: qty 2

## 2021-05-19 MED ORDER — FENTANYL CITRATE (PF) 100 MCG/2ML IJ SOLN
INTRAMUSCULAR | Status: AC
  Administered 2021-05-19: 100 ug via INTRAVENOUS
  Filled 2021-05-19: qty 2

## 2021-05-19 MED ORDER — FENTANYL CITRATE (PF) 250 MCG/5ML IJ SOLN
INTRAMUSCULAR | Status: AC
  Filled 2021-05-19: qty 5

## 2021-05-19 MED ORDER — ACETAMINOPHEN 160 MG/5ML PO SOLN: 325.0000 mg | ORAL | Status: DC | PRN

## 2021-05-19 MED ORDER — ORAL CARE MOUTH RINSE: 15.0000 mL | Freq: Once | OROMUCOSAL | Status: AC

## 2021-05-19 MED ORDER — ACETAMINOPHEN 10 MG/ML IV SOLN
INTRAVENOUS | Status: AC
  Filled 2021-05-19: qty 100

## 2021-05-19 MED ORDER — INSULIN GLARGINE-YFGN 100 UNIT/ML ~~LOC~~ SOLN
20.0000 [IU] | Freq: Every day | SUBCUTANEOUS | Status: DC
  Administered 2021-05-19: 20 [IU] via SUBCUTANEOUS
  Filled 2021-05-19 (×2): qty 0.2

## 2021-05-19 MED ORDER — MORPHINE SULFATE (PF) 4 MG/ML IV SOLN
4.0000 mg | INTRAVENOUS | Status: DC | PRN
Start: 2021-05-19 — End: 2021-05-20
  Administered 2021-05-19 – 2021-05-20 (×3): 4 mg via INTRAVENOUS
  Filled 2021-05-19 (×5): qty 1

## 2021-05-19 MED ORDER — LACTATED RINGERS IV SOLN: INTRAVENOUS | Status: DC | PRN

## 2021-05-19 MED ORDER — FENTANYL CITRATE (PF) 100 MCG/2ML IJ SOLN: 100.0000 ug | Freq: Once | INTRAMUSCULAR | Status: AC

## 2021-05-19 MED ORDER — LIDOCAINE 2% (20 MG/ML) 5 ML SYRINGE
INTRAMUSCULAR | Status: DC | PRN
  Administered 2021-05-19: 40 mg via INTRAVENOUS

## 2021-05-19 MED ORDER — CHLORHEXIDINE GLUCONATE 0.12 % MT SOLN: 15.0000 mL | Freq: Once | OROMUCOSAL | Status: AC

## 2021-05-19 MED ORDER — PROMETHAZINE HCL 25 MG/ML IJ SOLN: 6.2500 mg | INTRAMUSCULAR | Status: DC | PRN

## 2021-05-19 MED ORDER — MIDAZOLAM HCL 2 MG/2ML IJ SOLN: 1.0000 mg | Freq: Once | INTRAMUSCULAR | Status: AC

## 2021-05-19 MED ORDER — ACETAMINOPHEN 325 MG PO TABS: 325.0000 mg | ORAL_TABLET | ORAL | Status: DC | PRN

## 2021-05-19 MED ORDER — CEFAZOLIN SODIUM-DEXTROSE 1-4 GM/50ML-% IV SOLN
1.0000 g | Freq: Three times a day (TID) | INTRAVENOUS | Status: AC
  Administered 2021-05-19 – 2021-05-20 (×2): 1 g via INTRAVENOUS
  Filled 2021-05-19 (×2): qty 50

## 2021-05-19 MED ORDER — MIDAZOLAM HCL 2 MG/2ML IJ SOLN
INTRAMUSCULAR | Status: AC
  Administered 2021-05-19: 1 mg via INTRAVENOUS
  Filled 2021-05-19: qty 2

## 2021-05-19 MED ORDER — AMISULPRIDE (ANTIEMETIC) 5 MG/2ML IV SOLN: 10.0000 mg | Freq: Once | INTRAVENOUS | Status: DC | PRN

## 2021-05-19 SURGICAL SUPPLY — 86 items
ANCHOR SUT 1.45 SZ 1 SHORT (Anchor) ×2 IMPLANT
ANCHOR SUT KEITH ABD SZ2 STR (SUTURE) ×1 IMPLANT
BAG COUNTER SPONGE SURGICOUNT (BAG) ×2 IMPLANT
BIT DRILL 2.5X2.75 QC CALB (BIT) ×2 IMPLANT
BIT DRILL 2.9 CANN QC NONSTRL (BIT) ×2 IMPLANT
BIT DRILL 3.5X5.5 QC CALB (BIT) ×2 IMPLANT
BLADE 15 SAFETY STRL DISP (BLADE) ×6 IMPLANT
BLADE LONG MED 31X9 (MISCELLANEOUS) ×1 IMPLANT
BNDG COHESIVE 4X5 TAN ST LF (GAUZE/BANDAGES/DRESSINGS) ×3 IMPLANT
BNDG COHESIVE 6X5 TAN NS LF (GAUZE/BANDAGES/DRESSINGS) ×1 IMPLANT
BNDG COHESIVE 6X5 TAN ST LF (GAUZE/BANDAGES/DRESSINGS) ×1 IMPLANT
BNDG ELASTIC 4X5.8 VLCR STR LF (GAUZE/BANDAGES/DRESSINGS) ×3 IMPLANT
BNDG ELASTIC 6X5.8 VLCR STR LF (GAUZE/BANDAGES/DRESSINGS) ×3 IMPLANT
BNDG GAUZE ELAST 4 BULKY (GAUZE/BANDAGES/DRESSINGS) ×3 IMPLANT
CHLORAPREP W/TINT 26 (MISCELLANEOUS) ×4 IMPLANT
COVER SURGICAL LIGHT HANDLE (MISCELLANEOUS) ×3 IMPLANT
CUFF TOURN SGL QUICK 34 (TOURNIQUET CUFF) ×1
CUFF TRNQT CYL 34X4.125X (TOURNIQUET CUFF) ×2 IMPLANT
DECANTER SPIKE VIAL GLASS SM (MISCELLANEOUS) IMPLANT
DRAPE C-ARM 42X120 X-RAY (DRAPES) ×2 IMPLANT
DRAPE C-ARM MINI 42X72 WSTRAPS (DRAPES) ×1 IMPLANT
DRAPE C-ARMOR (DRAPES) ×3 IMPLANT
DRAPE EXTREMITY T 121X128X90 (DISPOSABLE) ×1 IMPLANT
DRAPE OEC MINIVIEW 54X84 (DRAPES) ×1 IMPLANT
DRAPE ORTHO SPLIT 77X108 STRL (DRAPES)
DRAPE SURG ORHT 6 SPLT 77X108 (DRAPES) ×2 IMPLANT
DRAPE U-SHAPE 47X51 STRL (DRAPES) ×3 IMPLANT
DRSG ADAPTIC 3X8 NADH LF (GAUZE/BANDAGES/DRESSINGS) ×1 IMPLANT
DRSG PAD ABDOMINAL 8X10 ST (GAUZE/BANDAGES/DRESSINGS) ×7 IMPLANT
ELECT REM PT RETURN 15FT ADLT (MISCELLANEOUS) ×3 IMPLANT
FACESHIELD WRAPAROUND (MASK) IMPLANT
FACESHIELD WRAPAROUND OR TEAM (MASK) ×1 IMPLANT
FIXATION ZIPTIGHT ANKLE SNDSMS (Ankle) ×2 IMPLANT
GAUZE SPONGE 4X4 12PLY STRL (GAUZE/BANDAGES/DRESSINGS) ×4 IMPLANT
GAUZE SPONGE 4X4 12PLY STRL LF (GAUZE/BANDAGES/DRESSINGS) ×2 IMPLANT
GAUZE XEROFORM 5X9 LF (GAUZE/BANDAGES/DRESSINGS) ×3 IMPLANT
GLOVE SRG 8 PF TXTR STRL LF DI (GLOVE) ×4 IMPLANT
GLOVE SURG ENC MOIS LTX SZ7.5 (GLOVE) ×6 IMPLANT
GLOVE SURG MICRO LTX SZ7.5 (GLOVE) ×6 IMPLANT
GLOVE SURG UNDER POLY LF SZ7.5 (GLOVE) ×6 IMPLANT
GLOVE SURG UNDER POLY LF SZ8 (GLOVE) ×2
K-WIRE ACE 1.6X6 (WIRE) ×12
KIT BASIN OR (CUSTOM PROCEDURE TRAY) ×3 IMPLANT
KIT TURNOVER KIT A (KITS) IMPLANT
KWIRE ACE 1.6X6 (WIRE) ×4 IMPLANT
NDL MAYO CATGUT SZ4 TPR NDL (NEEDLE) IMPLANT
NEEDLE HYPO 22GX1.5 SAFETY (NEEDLE) ×1 IMPLANT
NEEDLE MAYO CATGUT SZ4 (NEEDLE) IMPLANT
NS IRRIG 1000ML POUR BTL (IV SOLUTION) ×3 IMPLANT
PACK ORTHO EXTREMITY (CUSTOM PROCEDURE TRAY) ×3 IMPLANT
PAD CAST 4YDX4 CTTN HI CHSV (CAST SUPPLIES) ×12 IMPLANT
PADDING CAST ABS 4INX4YD NS (CAST SUPPLIES) ×1
PADDING CAST ABS COTTON 4X4 ST (CAST SUPPLIES) ×1 IMPLANT
PADDING CAST COTTON 4X4 STRL (CAST SUPPLIES) ×6
PADDING CAST COTTON 6X4 STRL (CAST SUPPLIES) ×3 IMPLANT
PLATE LOCK 4H 109 RT DIST FIB (Plate) ×2 IMPLANT
PROTECTOR NERVE ULNAR (MISCELLANEOUS) ×3 IMPLANT
SCREW ACE CAN 4.0 40M (Screw) ×2 IMPLANT
SCREW CORTICAL 3.5MM 22MM (Screw) ×2 IMPLANT
SCREW LOCK CORT STAR 3.5X12 (Screw) ×8 IMPLANT
SCREW LOCK CORT STAR 3.5X14 (Screw) ×8 IMPLANT
SCREW LOW PROFILE 12MMX3.5MM (Screw) ×2 IMPLANT
SCREW NON LOCKING LP 3.5 14MM (Screw) ×2 IMPLANT
SPLINT PLASTER CAST XFAST 5X30 (CAST SUPPLIES) ×1 IMPLANT
SPLINT PLASTER XFAST SET 5X30 (CAST SUPPLIES) ×1
SPONGE T-LAP 18X18 ~~LOC~~+RFID (SPONGE) ×5 IMPLANT
STAPLER VISISTAT 35W (STAPLE) ×1 IMPLANT
STOCKINETTE TUBULAR 6 INCH (GAUZE/BANDAGES/DRESSINGS) ×1 IMPLANT
STOCKINETTE TUBULAR COTT 4X25 (GAUZE/BANDAGES/DRESSINGS) ×1 IMPLANT
STRIP CLOSURE SKIN 1/2X4 (GAUZE/BANDAGES/DRESSINGS) ×1 IMPLANT
SUCTION FRAZIER HANDLE 10FR (MISCELLANEOUS)
SUCTION TUBE FRAZIER 10FR DISP (MISCELLANEOUS) ×1 IMPLANT
SUT ETHILON 3 0 PS 1 (SUTURE) ×5 IMPLANT
SUT MON AB 3-0 SH 27 (SUTURE) ×2
SUT MON AB 3-0 SH27 (SUTURE) ×3 IMPLANT
SUT VIC AB 2-0 CT1 27 (SUTURE)
SUT VIC AB 2-0 CT1 TAPERPNT 27 (SUTURE) ×1 IMPLANT
SUT VIC AB 2-0 SH 27 (SUTURE)
SUT VIC AB 2-0 SH 27XBRD (SUTURE) ×1 IMPLANT
SUT VIC AB 3-0 PS2 18 (SUTURE)
SUT VIC AB 3-0 PS2 18XBRD (SUTURE) ×1 IMPLANT
SUT VIC AB 3-0 SH 27 (SUTURE)
SUT VIC AB 3-0 SH 27X BRD (SUTURE) ×2 IMPLANT
SYR CONTROL 10ML LL (SYRINGE) ×1 IMPLANT
WATER STERILE IRR 1000ML POUR (IV SOLUTION) ×3 IMPLANT
ZIPTIGHT ANKLE SYNODESMOSS FIX (Ankle) ×6 IMPLANT

## 2021-05-19 NOTE — Progress Notes (Addendum)
Subjective: Day of Surgery Procedure(s) (LRB): OPEN REDUCTION INTERNAL FIXATION (ORIF) RIGHT ANKLE FRACTURE, SYNDESMOSIS/DELTOID FIXATION, REMOVAL OF LOOSE BODY (Right)  Patient reports pain as mild to moderate. She is ready for surgery today. She has been working with primary team on glycemic optimization.  Objective:   VITALS:  Temp:  [97.7 F (36.5 C)-98.6 F (37 C)] 98.5 F (36.9 C) (02/04 1451) Pulse Rate:  [89-109] 91 (02/04 1110) Resp:  [16-39] 19 (02/04 1110) BP: (98-155)/(64-135) 135/122 (02/04 1451) SpO2:  [96 %-100 %] 98 % (02/04 1110)  Gen: AAOx3, NAD  Right lower extremity: Well padded short leg splint in place Wiggles toes SILT over toes CR<2s    LABS Recent Labs    05/17/21 0454 05/19/21 0541  HGB 13.0 12.6  WBC 9.3 10.8*  PLT 428* 526*   Recent Labs    05/18/21 0307 05/19/21 0541  NA 134* 133*  K 3.9 4.0  CL 101 97*  CO2 23 24  BUN 12 11  CREATININE 0.66 0.74  GLUCOSE 189* 298*   No results for input(s): LABPT, INR in the last 72 hours.   Assessment/Plan: 36 yo F poorly controlled type 1 diabetic who is currently admitted to the Medicine team for preoperative optimization and glycemic control prior to right trimalleolar ankle fracture ORIF scheduled for 05/16/21  She is agreeable to proceed with right ankle ORIF, possible syndesmosis fixation and/or deltoid repair, possible external fixation with delayed definitive fixation today Saturday 05/19/21.   She has been NPO from [place] on Saturday 0000h and VTE prophylaxis was held prior to Sat morning.  Preoperative orders placed.  Armond Hang 05/19/2021, 3:05 PM

## 2021-05-19 NOTE — Progress Notes (Signed)
MDA notified patients CBG in PACU is 215 MDA doesn't want to treat at this time.  Will continue to monitor and notify for further changes.

## 2021-05-19 NOTE — Op Note (Addendum)
05/19/2021  3:07 PM   PATIENT: Debra Howell  36 y.o. female  MRN: [id number]   PRE-OPERATIVE DIAGNOSIS:   Displaced trimalleolar fracture of right lower leg   POST-OPERATIVE DIAGNOSIS:   Displaced trimalleolar fracture of right lower leg   PROCEDURE: Right trimalleolar ankle fracture ORIF without fixation of posterior lip Right ankle syndesmosis fixation x 2 Right ankle arthrotomy with irrigation and removal of interposed tissue and loose bodies Right ankle deltoid ligament repair   SURGEON:  Armond Hang, MD   ASSISTANT: None   ANESTHESIA: General, regional   EBL: Minimal   TOURNIQUET:    Total Tourniquet Time Documented: Thigh (Right) - 124 minutes Total: Thigh (Right) - [id number] minutes    COMPLICATIONS: None apparent   DISPOSITION: Extubated, awake and stable to recovery.   INDICATION FOR PROCEDURE: The patient is a 36 yo F poorly controlled type 1 diabetic who is currently admitted to the Medicine team for preoperative optimization and glycemic control prior to right trimalleolar ankle fracture ORIF scheduled for 05/16/21. She was seen as outpatient with reported blood sugars in the 500+ mg/dL range. Per the patient, she had a hypoglycemic event with LOC on 05/09/21 and fell sustaining right ankle fracture dislocation. She was closed reduced and splinted at [facility]. She has not been to a PCP in 2 years and reports that her blood sugars are routinely over 300+ with A1c over 8. 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 She lives with him and her two daughters.   We discussed the diagnosis, alternative treatment options, risks and benefits of the above surgical intervention, as well as alternative non-operative treatments. All questions/concerns were addressed and the patient/family demonstrated appropriate understanding of the diagnosis, the procedure, the postoperative course, and overall prognosis. The patient wished to proceed with surgical  intervention and signed an informed surgical consent as such, in each others presence prior to surgery.   PROCEDURE IN DETAIL: After preoperative consent was obtained and the correct operative site was identified, the patient was brought to the operating room supine on stretcher and transferred onto operating table. General anesthesia was induced. Preoperative antibiotics were administered. Surgical timeout was taken. The patient was then positioned supine with an ipsilateral hip bump. The operative lower extremity was prepped and draped in standard sterile fashion with a tourniquet around the thigh. The extremity was exsanguinated and the tourniquet was inflated to 300 mmHg.  A standard lateral incision was made over the distal fibula. Dissection was carried down to the level of the fibula and the fracture site identified. The superficial peroneal nerve was identified and protected throughout the procedure. The fibula was noted to be shortened with interposed periosteum. The fibula was brought out to length. The fibula fracture was debrided and the edges defined to achieve cortical read. Reduction maneuver was performed using pointed reduction forceps and lobster forceps. In this manner, the fibula length was restored and fracture reduced. A lag screw was not able to be placed due to the orientation of fracture lines and severe comminution. Due to poor bone quality and extensive comminution at the fracture site, it was decided to use an anatomic locking distal fibula plate. We then selected a Zimmer anatomic locking plate to match the anatomy of the distal fibula and placed it laterally. This was implanted under intraoperative fluoroscopy with a combination of distal locking screws and proximal cortical & locking screws.  We then turned to the medial malleolar fracture. After obtaining reduction under fluoroscopy, a Kirschner wire  was placed to secure this reduction and to serve as guide for a cannulated  screw. We then made an incision around the wire and overdrilled this with a cannulated drill. We then placed a 4.0 mm Zimmer Biomet partially threaded lag screw. This screw was noted to achieve excellent compression across the fracture site and also have excellent purchase. We verified position of the screw and fracture reduction in all planes with fluoroscopy.   A manual external rotation stress radiograph was obtained and demonstrated widening of the ankle mortise. Given this intraoperative finding as well as preoperative subluxation, it was decided to reduce and fix the syndesmosis. Therefore a suture fixation system (ZipTight device) was implanted through the fibula plate in cannulated fashion to fix the syndesmosis. Anchor/button position was verified along anteromedial tibial cortex by fluoroscopy. This sequence was repeated once again with a second suture fixation device. A repeat stress radiograph showed complete stability of the ankle mortise to testing but there was still an apparent valgus laxity to the medial ankle mortise.   Therefore, we proceeded with making an anteromedial incision over the ankle. We carefully protected the saphenous vein and its branches throughout the procedure. We carried dissection down to the medial malleolus and ankle joint capsule which was found to be torn. We then found torn deltoid ligament with interposition within the medial ankle mortise. We freed this interposed ligament and thoroughly irrigated the ankle joint removing loose bodies. We then observed under fluoroscopy that the valgus laxity was much improved. We used a 1.4 mm wire to prepare the medial malleolus for an all-suture anchor Pharmacist, hospital) with planned placement anterior to the previously placed medial malleolus screw. The suture strands from this anchor were passed distally through the deltoid ligament and then tied. We then brought these up into the area of the medial malleolus and tied these down. We  also directly inspected the previously placed medial malleolus screw and noted it to be in appropriate position.   A repeat stress radiograph showed complete stability and congruency of the ankle mortise to testing.  The surgical sites were thoroughly irrigated. The tourniquet was deflated and hemostasis achieved. The deep layers were closed using 2-0 vicryl and the subcutaneous tissues closed using 3-0 vicryl. The skin was closed without tension using 3-0 nylon suture.    The leg was cleaned with saline and sterile xeroform dressings with gauze were applied. A well padded bulky short leg splint was applied. The patient was awakened from anesthesia and transported to the recovery room in stable condition.    FOLLOW UP PLAN: -transfer to PACU, then return to [facility] under Medicine -IV Ancef x 24 hrs -strict NWB operative extremity, maximum elevation -maintain short leg splint until follow up -pain meds per primary team -DVT Ppx: Aspirin 325 mg twice daily x 30 days (or as preferred by primary team) -PT/OT to assist with crutch training -discharge home per primary team -follow up in 7-10 days for wound check and splint change -sutures out in 2-3 weeks with exchange of short leg splint to short leg cast in outpatient office   RADIOGRAPHS: AP, lateral, oblique and stress radiographs of the right ankle were obtained intraoperatively. These showed interval reduction and fixation of the fractures. Manual stress radiographs were taken and the ankle mortise and tibiofibular relationship were noted to be stable following fixation. All hardware is appropriately positioned and of the appropriate lengths. No other acute injuries are noted.   Armond Hang Orthopaedic Surgery [facility]

## 2021-05-19 NOTE — Brief Op Note (Signed)
05/19/2021  2:49 PM  PATIENT:  Ludwika Rodd  36 y.o. female  PRE-OPERATIVE DIAGNOSIS:  Displaced trimalleolar fracture of right lower leg  POST-OPERATIVE DIAGNOSIS:  Displaced trimalleolar fracture of right lower leg  PROCEDURE:   Right trimalleolar ankle fracture ORIF without fixation of posterior lip Right ankle syndesmosis fixation x 2 Right ankle arthrotomy with irrigation and removal of interposed tissue and loose bodies Right ankle deltoid ligament repair  SURGEON:  Surgeon(s) and Role:    Netta Cedars, MD - Primary  PHYSICIAN ASSISTANT: None  ASSISTANTS: none   ANESTHESIA:   regional and general  EBL:  Minimal   BLOOD ADMINISTERED:none  DRAINS: none   LOCAL MEDICATIONS USED:  OTHER Vanc powder  SPECIMEN:  No Specimen  DISPOSITION OF SPECIMEN:  N/A  COUNTS:  YES  TOURNIQUET:   Total Tourniquet Time Documented: Thigh (Right) - 124 minutes Total: Thigh (Right) - 124 minutes   DICTATION: .Note written in EPIC  PLAN OF CARE:  Return to inpatient  PATIENT DISPOSITION:  PACU - hemodynamically stable.   Delay start of Pharmacological VTE agent (>24hrs) due to surgical blood loss or risk of bleeding: yes

## 2021-05-19 NOTE — Anesthesia Postprocedure Evaluation (Signed)
Anesthesia Post Note  Patient: Debra Howell  Procedure(s) Performed: OPEN REDUCTION INTERNAL FIXATION (ORIF) RIGHT ANKLE FRACTURE, SYNDESMOSIS/DELTOID FIXATION, REMOVAL OF LOOSE BODY (Right: Ankle)     Patient location during evaluation: PACU Anesthesia Type: General Level of consciousness: awake and alert Pain management: pain level controlled Vital Signs Assessment: post-procedure vital signs reviewed and stable Respiratory status: spontaneous breathing, nonlabored ventilation, respiratory function stable and patient connected to nasal cannula oxygen Cardiovascular status: blood pressure returned to baseline and stable Postop Assessment: no apparent nausea or vomiting Anesthetic complications: no Comments: Pt sleeping but only complaining of pain after being awakened.    No notable events documented.  Last Vitals:  Vitals:   05/19/21 1530 05/19/21 1600  BP: (!) 122/92 127/75  Pulse: 81 96  Resp: 14 18  Temp:  36.6 C  SpO2: 100% 100%            Shelton Silvas

## 2021-05-19 NOTE — Anesthesia Procedure Notes (Signed)
Anesthesia Regional Block: Popliteal block   Pre-Anesthetic Checklist: , timeout performed,  Correct Patient, Correct Site, Correct Laterality,  Correct Procedure, Correct Position, site marked,  Risks and benefits discussed,  Surgical consent,  Pre-op evaluation,  At surgeon's request and post-op pain management  Laterality: Right  Prep: chloraprep       Needles:  Injection technique: Single-shot  Needle Type: Echogenic Stimulator Needle     Needle Length: 9cm  Needle Gauge: 21     Additional Needles:   Procedures:,,,, ultrasound used (permanent image in chart),,    Narrative:  Start time: 05/19/2021 11:05 AM End time: 05/19/2021 11:10 AM Injection made incrementally with aspirations every 5 mL.  Performed by: Personally  Anesthesiologist: Shelton Silvas, MD  Additional Notes: Patient did not tolerate the procedure well. Local anesthetic introduced in an incremental fashion under minimal resistance after negative aspirations. No paresthesias were elicited. Pt complained of heel pain in block pillow and pain at the needle insertion site. She refused to answer questions during the procedure but did deny radiation of the pain to the foot. After completion of the procedure, patient did not respond when asked about the additional block. We did not proceed with saphenous nerve block. Patient continued to be monitored by RN.

## 2021-05-19 NOTE — Anesthesia Procedure Notes (Signed)
Procedure Name: LMA Insertion Date/Time: 05/19/2021 11:38 AM Performed by: Carlos American, CRNA Pre-anesthesia Checklist: Patient identified, Emergency Drugs available, Suction available and Patient being monitored Patient Re-evaluated:Patient Re-evaluated prior to induction Oxygen Delivery Method: Circle System Utilized Preoxygenation: Pre-oxygenation with 100% oxygen Induction Type: IV induction Ventilation: Mask ventilation without difficulty LMA: LMA inserted LMA Size: 4.0 Number of attempts: 1 Placement Confirmation: positive ETCO2 Tube secured with: Tape Dental Injury: Teeth and Oropharynx as per pre-operative assessment

## 2021-05-19 NOTE — Progress Notes (Signed)
Family Medicine Teaching Service Daily Progress Note Intern Pager: [phone]  Patient name: Debra Howell Medical record number: [id number] Date of birth: [birth year] Age: 36 y.o. Gender: female  Primary Care Provider: Pcp, No Consultants: Orthopedic surgery Code Status: Full  Pt Overview and Major Events to Date:  05/16/21: Admitted to FPTS, orthopedic surgery consulted   Assessment and Plan: Debra Howell is a 36 year old female presenting with acute trimalleolar fracture that occurred after a fall from standing 1 week ago, reports of hypoglycemia at this time.  Past medical history significant for type 1 diabetes mellitus.  Trimalleolar fracture   Fall  Patient had a hypoglycemic event that caused her to fall a little over a week ago.  She injured her right ankle and found to have a trimalleolar fracture.  Surgical intervention is planned for today. -Orthopedic surgery following, recommendations - Morphine 4 mg q3h PRN while NPO  -Tylenol 650 mg every 6 hours -Oxycodone 6 mg every 4 hours as needed -VTE prophylaxis held for surgery, restart when able -Has been n.p.o. since midnight  Type 1 diabetes mellitus with Hypoglycemia   Pseudohyponatremia Fasting CBG 189 this morning.  CBGs range 115-331, she has received 19 units of NovoLog with 15 units of Semglee in the past 24 hours. -Continue Semglee 15 units at bedtime -Sensitive sliding scale insulin -Hold 6u meal coverage and SSI while NPO -Diabetic coordinator consulted, appreciate recommendations -Monitor CBGs -A.m. BMP  Social needs TOC consulted for PCP   FEN/GI: Carb modified diet PPx: Holding Lovenox pending surgery  Disposition: pending orthopedic surgery   Subjective:  No significant overnight events. Has leg pain this morning rate 6/10.  Awaits her surgery today.   Objective: Temp:  [97.6 F (36.4 C)-98.2 F (36.8 C)] 98.2 F (36.8 C) (02/04 0435) Pulse Rate:  [83-100] 100 (02/04 0435) Resp:  [17-18] 18 (02/04  0435) BP: (99-115)/(66-90) 99/66 (02/04 0435) SpO2:  [98 %-100 %] 100 % (02/04 0435) Physical Exam: General: alert, in no acute distress  Cardiovascular: RRR Respiratory: clear to ausculation bilaterally  Abdomen: soft, non-tender  Extremities: splint on RLE   Laboratory: Recent Labs  Lab 05/15/21 1714 05/17/21 0454 05/19/21 0541  WBC 11.6* 9.3 10.8*  HGB 12.2 13.0 12.6  HCT 38.2 39.5 39.1  PLT 452* 428* 526*   Recent Labs  Lab 05/15/21 1714 05/17/21 0454 05/18/21 0307  NA 134* 132* 134*  K 5.0 4.4 3.9  CL 101 100 101  CO2 23 21* 23  BUN 14 9 12   CREATININE 0.75 0.78 0.66  CALCIUM 8.5* 8.5* 8.5*  GLUCOSE 258* 337* 189*      Imaging/Diagnostic Tests: No results found. CT Right ankle: Acute trimalleolar fracture    , DO 05/19/2021, 6:38 AM    PGY-3, [facility] FPTS Intern pager: [phone], text pages welcome

## 2021-05-19 NOTE — H&P (Signed)
H&P Update: ? ?-History and Physical Reviewed ? ?-Patient has been re-examined ? ?-No change in the plan of care ? ?-The risks and benefits were presented and reviewed. The risks due to hardware failure/irritation, new/persistent infection, stiffness, nerve/vessel/tendon injury, nonunion/malunion, wound healing issues, development of arthritis, failure of this surgery, possibility of external fixation with delayed definitive surgery, need for further surgery, thromboembolic events, anesthesia/medical complications, amputation, death among others were discussed. The patient acknowledged the explanation, agreed to proceed with the plan and a consent was signed. ? ?Debra Howell ? ?

## 2021-05-19 NOTE — Progress Notes (Signed)
Inpatient Diabetes Program Recommendations  AACE/ADA: New Consensus Statement on Inpatient Glycemic Control (2015)  Target Ranges:  Prepandial:   less than 140 mg/dL      Peak postprandial:   less than 180 mg/dL (1-2 hours)      Critically ill patients:  140 - 180 mg/dL   Lab Results  Component Value Date   GLUCAP 294 (H) 05/19/2021   HGBA1C 7.9 (H) 05/15/2021    Review of Glycemic Control  Latest Reference Range & Units 05/18/21 08:26 05/18/21 11:39 05/18/21 16:49 05/18/21 22:14 05/19/21 02:37 05/19/21 07:37  Glucose-Capillary 70 - 99 mg/dL 696 (H) 789 (H) 381 (H) 331 (H) 189 (H) 294 (H)  (H): Data is abnormally high    Diabetes history: DM1; makes NO insulin, requires basal, correction, and meal coverage insulin Outpatient Diabetes medications: Regular 15-25 units TID with meals Current orders for Inpatient glycemic control: Semglee 15 units QHS, Novolog 0-9  units TID with meals, Novolog 0-5 units QHS, Novolog 3 units TID with meals  Inpatient Diabetes Program Recommendations:    Fasting CBG was 294 this morning at 07:37.  Please consider, Semglee 20 units QHS. Will continue to follow while inpatient.  Thank you, Dulce Sellar, MSN, RN Diabetes Coordinator Inpatient Diabetes Program [phone] (team pager from 8a-5p)

## 2021-05-19 NOTE — Progress Notes (Signed)
FPTS Brief Progress Note  S: Resting, did not disturb or awaken   O: BP 115/90 (BP Location: Left Arm)    Pulse 95    Temp 97.8 F (36.6 C) (Oral)    Resp 18    Wt 99.5 kg    SpO2 100%   GEN: Comfortable appearing laying in bed  A/P: Plan per day team note - Orders reviewed. Labs for AM ordered, which was adjusted as needed.   Gerrit Heck, MD 05/19/2021, 1:20 AM PGY-1, [facility] Family Medicine Night Resident  Please page [phone] with questions.

## 2021-05-19 NOTE — Anesthesia Preprocedure Evaluation (Addendum)
Anesthesia Evaluation  Patient identified by MRN, date of birth, ID band Patient awake    Reviewed: Allergy & Precautions, NPO status , Patient's Chart, lab work & pertinent test results  Airway Mallampati: II  TM Distance: >3 FB Neck ROM: Full    Dental  (+) Teeth Intact, Dental Advisory Given   Pulmonary neg pulmonary ROS,    breath sounds clear to auscultation       Cardiovascular + Valvular Problems/Murmurs  Rhythm:Regular Rate:Normal     Neuro/Psych negative neurological ROS  negative psych ROS   GI/Hepatic negative GI ROS, Neg liver ROS,   Endo/Other  diabetes, Type 2, Insulin DependentHypothyroidism   Renal/GU      Musculoskeletal   Abdominal (+) + obese,   Peds  Hematology   Anesthesia Other Findings   Reproductive/Obstetrics                            Anesthesia Physical Anesthesia Plan  ASA: 2  Anesthesia Plan: General   Post-op Pain Management: Regional block   Induction: Intravenous  PONV Risk Score and Plan: 4 or greater and Ondansetron, Dexamethasone, Midazolam and Scopolamine patch - Pre-op  Airway Management Planned: LMA  Additional Equipment: None  Intra-op Plan:   Post-operative Plan: Extubation in OR  Informed Consent: I have reviewed the patients History and Physical, chart, labs and discussed the procedure including the risks, benefits and alternatives for the proposed anesthesia with the patient or authorized representative who has indicated his/her understanding and acceptance.       Plan Discussed with: CRNA  Anesthesia Plan Comments: (Pt not very communicative. )       Anesthesia Quick Evaluation

## 2021-05-19 NOTE — Transfer of Care (Signed)
Immediate Anesthesia Transfer of Care Note  Patient: Debra Howell  Procedure(s) Performed: OPEN REDUCTION INTERNAL FIXATION (ORIF) RIGHT ANKLE FRACTURE, SYNDESMOSIS/DELTOID FIXATION, REMOVAL OF LOOSE BODY (Right: Ankle)  Patient Location: PACU  Anesthesia Type:GA combined with regional for post-op pain  Level of Consciousness: drowsy and patient cooperative  Airway & Oxygen Therapy: Patient Spontanous Breathing  Post-op Assessment: Report given to RN and Post -op Vital signs reviewed and stable  Post vital signs: Reviewed and stable  Last Vitals:  Vitals Value Taken Time  BP 135/122 05/19/21 1453  Temp    Pulse 92 05/19/21 1455  Resp 26 05/19/21 1455  SpO2 100 % 05/19/21 1455  Vitals shown include unvalidated device data.  Last Pain:  Vitals:   05/19/21 1025  TempSrc: Oral  PainSc: 6          Complications: No notable events documented.

## 2021-05-19 NOTE — Discharge Instructions (Addendum)
Dear Berdie Ogren,  Thank you for letting us participate in your care. You were hospitalized for a Trimalleolar fracture and due to elevated blood sugar readings. You were treated with both long-acting and short-acting insulin, as well as surgery for your broken ankle. Your pain was managed with Tylenol and some narcotic pain medication as needed. We are so glad that all went well with the surgery. Please see below for more specific instructions related to your surgery and post-operative care. For your diabetes, we are sending prescription for insulin pen, insulin needle, both long and short acting insulin and a continuous glucose monitoring device. It is important that you follow up with [place] outpatient, we may also refer you to an Endocrinologist for further management of your diabetes.    POST-HOSPITAL & CARE INSTRUCTIONS See below for instructions from your Orthopedic Surgeon. Continue to take Tylenol and/or Ibuprofen as needed. The narcotic pain medication should only be used for severe pain. Check your blood sugar levels regularly throughout the day- your device should alert you if you have any low sugar readings. Take your insulin daily and with meals as prescribed.  Go to your follow up appointments (listed below). Please follow up at [facility] for your appointment with Dr. Thompson Grayer on 2/10 at 9:50 am. Please arrive 15 minutes prior to your scheduled appointment time.  Please make sure that you take aspirin twice daily for 30 days.    DOCTOR'S APPOINTMENT   No future appointments.  Follow-up Information     [place]. Call.   Why: Call to schedule an appointment to establish care and follow up. Contact information: [place]        Armond Hang, MD. Call.   Specialty: Orthopedic Surgery Why: Call to schedule an appointment to be seen within the next 7-10 days. Contact information: [address]  [place] [address] [phone]                 Take care and be well!  [facility]  [address] [place], [place] [address] [phone]        Armond Hang, MD [facility]  Please read the following information regarding your care after surgery.  Medications  You only need a prescription for the narcotic pain medicine (ex. oxycodone, Percocet, Norco).  All of the other medicines listed below are available over the counter. ? Aleve 2 pills twice a day for the first 3 days after surgery. ? acetominophen (Tylenol) 650 mg every 4-6 hours as you need for minor to moderate pain ? oxycodone as prescribed for severe pain  ? To help prevent blood clots, take aspirin (325 mg) twice daily for 30 days after surgery.  You should also get up every hour while you are awake to move around.  Weight Bearing ? Do NOT bear any weight on the operated leg or foot.  Cast / Splint / Dressing ? If you have a splint, do NOT remove this. Keep your splint, cast or dressing clean and dry.  Dont put anything (coat hanger, pencil, etc) down inside of it.  If it gets wet, call the office immediately to schedule an appointment for a cast change.  Swelling IMPORTANT: It is normal for you to have swelling where you had surgery. To reduce swelling and pain, keep at least 3 pillows under your leg so that your toes  are above your nose and your heel is above the level of your hip.  It may be necessary to keep your foot or leg elevated for several weeks.  This is critical to helping your incisions heal and your pain to feel better.  Follow Up Call my office at [phone] when you are discharged from the hospital or surgery center to schedule an appointment to be seen 1-2 weeks after surgery.  Call my office at [phone] if you develop a fever >101.5 F, nausea, vomiting, bleeding from the surgical site or  severe pain.

## 2021-05-20 LAB — CBC
HCT: 37.2 % (ref 36.0–46.0)
Hemoglobin: 12 g/dL (ref 12.0–15.0)
MCH: 31.8 pg (ref 26.0–34.0)
MCHC: 32.3 g/dL (ref 30.0–36.0)
MCV: 98.7 fL (ref 80.0–100.0)
Platelets: 516 10*3/uL — ABNORMAL HIGH (ref 150–400)
RBC: 3.77 MIL/uL — ABNORMAL LOW (ref 3.87–5.11)
RDW: 12.3 % (ref 11.5–15.5)
WBC: 13.3 10*3/uL — ABNORMAL HIGH (ref 4.0–10.5)
nRBC: 0 % (ref 0.0–0.2)

## 2021-05-20 LAB — GLUCOSE, CAPILLARY
Glucose-Capillary: 301 mg/dL — ABNORMAL HIGH (ref 70–99)
Glucose-Capillary: 274 mg/dL — ABNORMAL HIGH (ref 70–99)

## 2021-05-20 LAB — BASIC METABOLIC PANEL
Anion gap: 12 (ref 5–15)
BUN: 8 mg/dL (ref 6–20)
CO2: 24 mmol/L (ref 22–32)
Calcium: 8.5 mg/dL — ABNORMAL LOW (ref 8.9–10.3)
Chloride: 100 mmol/L (ref 98–111)
Creatinine, Ser: 0.68 mg/dL (ref 0.44–1.00)
GFR, Estimated: >60 mL/min (ref >60)
Glucose, Bld: 129 mg/dL — ABNORMAL HIGH (ref 70–99)
Potassium: 3.7 mmol/L (ref 3.5–5.1)
Sodium: 136 mmol/L (ref 135–145)

## 2021-05-20 MED ORDER — IBUPROFEN 200 MG PO TABS
600.0000 mg | ORAL_TABLET | Freq: Four times a day (QID) | ORAL | Status: DC
  Administered 2021-05-20 (×2): 600 mg via ORAL
  Filled 2021-05-20 (×2): qty 3

## 2021-05-20 MED ORDER — FREESTYLE LIBRE 2 READER DEVI: 1.0000 | Freq: Three times a day (TID) | 0 refills | Status: DC

## 2021-05-20 MED ORDER — NOVOLOG FLEXPEN 100 UNIT/ML ~~LOC~~ SOPN: 6.0000 [IU] | PEN_INJECTOR | Freq: Three times a day (TID) | SUBCUTANEOUS | 11 refills | Status: DC

## 2021-05-20 MED ORDER — CYCLOBENZAPRINE HCL 5 MG PO TABS
7.5000 mg | ORAL_TABLET | Freq: Three times a day (TID) | ORAL | Status: DC | PRN
  Administered 2021-05-20: 7.5 mg via ORAL
  Filled 2021-05-20: qty 2

## 2021-05-20 MED ORDER — ASPIRIN 325 MG PO TABS: 325.0000 mg | ORAL_TABLET | Freq: Two times a day (BID) | ORAL | 0 refills | Status: AC

## 2021-05-20 MED ORDER — FREESTYLE LIBRE 2 SENSOR MISC: 1.0000 | Freq: Three times a day (TID) | 0 refills | Status: DC

## 2021-05-20 MED ORDER — OXYCODONE HCL 5 MG PO TABS
5.0000 mg | ORAL_TABLET | Freq: Once | ORAL | Status: AC
  Administered 2021-05-20: 5 mg via ORAL

## 2021-05-20 MED ORDER — INSULIN GLARGINE 100 UNIT/ML SOLOSTAR PEN: 20.0000 [IU] | PEN_INJECTOR | Freq: Every day | SUBCUTANEOUS | 11 refills | Status: DC

## 2021-05-20 MED ORDER — INSULIN PEN NEEDLE 32G X 4 MM MISC: 100.0000 | 0 refills | Status: DC | PRN

## 2021-05-20 MED ORDER — POLYETHYLENE GLYCOL 3350 17 G PO PACK: 17.0000 g | PACK | Freq: Every day | ORAL | 0 refills | Status: DC

## 2021-05-20 MED ORDER — ASPIRIN 325 MG PO TABS
325.0000 mg | ORAL_TABLET | Freq: Two times a day (BID) | ORAL | Status: DC
  Administered 2021-05-20 (×2): 325 mg via ORAL
  Filled 2021-05-20 (×2): qty 1

## 2021-05-20 MED ORDER — OXYCODONE HCL 5 MG PO TABS: 5.0000 mg | ORAL_TABLET | ORAL | 0 refills | Status: AC | PRN

## 2021-05-20 NOTE — Progress Notes (Signed)
FPTS Brief Note Reviewed patient's vitals, recent notes.  Vitals:   05/19/21 1600 05/19/21 1945  BP: 127/75 118/73  Pulse: 96 (!) 104  Resp: 18 14  Temp: 97.8 F (36.6 C) 98.1 F (36.7 C)  SpO2: 100% 99%   At this time, no change in plan from day progress note.  Cora Collum, DO Page [phone] with questions about this patient.

## 2021-05-20 NOTE — Progress Notes (Signed)
Order to discharge pt home.  Discharge instructions/AVS given to patient and reviewed - education provided as needed.  Assistive devices delivered to pt room for home use.  Pt advised to call PCP and/or come back to the hospital if there are any problems. Pt verbalized understanding.

## 2021-05-20 NOTE — TOC Transition Note (Addendum)
Transition of Care [facility]) - CM/SW Discharge Note   Patient Details  Name: Debra Howell MRN: [id number] Date of Birth: 1985-06-03  Transition of Care [facility]) CM/SW Contact:  Bess Kinds, RN Phone Number: [phone] 05/20/2021, 3:28 PM   Clinical Narrative:     Patient to transition home today. Notified [employer] for DME needs for delivery to the room. Referral for [facility] PT accepted by [facility].   Spoke with patient about accepting [facility] agency for PT. Patient in agreement. DME received.   No further TOC needs identified.   Final next level of care: Home w Home Health Services Barriers to Discharge: No Barriers Identified   Patient Goals and CMS Choice        Discharge Placement                       Discharge Plan and Services                                     Social Determinants of Health (SDOH) Interventions     Readmission Risk Interventions No flowsheet data found.

## 2021-05-20 NOTE — Plan of Care (Signed)

## 2021-05-20 NOTE — Progress Notes (Signed)
Family Medicine Teaching Service Daily Progress Note Intern Pager: [phone]  Patient name: Debra Howell Medical record number: [id number] Date of birth: 27-Dec-1985 Age: 36 y.o. Gender: female  Primary Care Provider: Pcp, No Consultants: Ortho  Code Status: FULL CODE   Pt Overview and Major Events to Date:  2/1: Admitted 2/4: ORIF to right ankle   Assessment and Plan: Debra Howell is a 36 y.o. female who was admitted for trimalleolar fracture of right ankle and poorly controlled diabetes. PMHx is only significant for Type 1 DM.   Trimalleolar Fracture s/p ground-level fall  POD #1 from ORIF without complication. Reports pain is improved compared to last night. CBC today shows slight leukocytosis at 13.3, likely reactive in the setting of recent surgery. Vitals stable, afebrile.   -PT/OT eval and assistance prior to dc -Scheduled Tylenol and Ibuprofen q6h for pain control -D/C Morphine -Oxy IR as needed for severe, breakthrough pain -Miralax 17 g daily (has been refusing)  -ASA 325 mg BID x30 days for VTE ppxx -F/u with Ortho outpatient for suture removal and cast placement  -Non-weight bearing on right lower extremity -Anticipate d/c today after PT/OT eval  Type 1 DM with hyperglycemia Glucose 129 on morning BMP. Will send discharge orders to include freestyle libre continuous glucose monitor, Lantus, NovoLog, insulin pen to preferred pharmacy.  Appreciate diabetic educators helping to further educate patient on disease process and management.  She will need continued education outpatient. -F/u outpatient; anticipate referral to endocrinology for further management  -Will d/c with 20 U Semglee, 6U Novolog TID with meals and patient to calculate carb correction  -Will need diabetic eye examination outpatient   FEN/GI: Carb modified PPx: ASA 325 mg BID x 30 days  Dispo:Home today. Barriers include PT/OT eval and recommendations.   Subjective:  Reports doing better than last night.  Pain is more controlled- rates 5/10. Excited about possibility of going home today. States she doesn't like crutches but willing to work with PT, understands needs to be non-weight bearing to right lower extremity.  Objective: Temp:  [97.7 F (36.5 C)-98.6 F (37 C)] 98.1 F (36.7 C) (02/04 1945) Pulse Rate:  [67-109] 104 (02/04 1945) Resp:  [9-39] 14 (02/04 1945) BP: (98-205)/(64-192) 118/73 (02/04 1945) SpO2:  [88 %-100 %] 99 % (02/04 1945) Physical Exam: General: Pleasant, NAD, cooperative Cardiovascular: RRR without murmur Respiratory: CTAB without wheezing/rhonchi/rales Abdomen: Obese, non-tender, non-distended, soft  Extremities: RLE is short leg splint, elevated above bed  Laboratory: Recent Labs  Lab 05/15/21 1714 05/17/21 0454 05/19/21 0541  WBC 11.6* 9.3 10.8*  HGB 12.2 13.0 12.6  HCT 38.2 39.5 39.1  PLT 452* 428* 526*   Recent Labs  Lab 05/17/21 0454 05/18/21 0307 05/19/21 0541  NA 132* 134* 133*  K 4.4 3.9 4.0  CL 100 101 97*  CO2 21* 23 24  BUN 9 12 11   CREATININE 0.78 0.66 0.74  CALCIUM 8.5* 8.5* 8.9  GLUCOSE 337* 189* 298*    Imaging/Diagnostic Tests: DG Ankle Complete Right  Result Date: 05/19/2021 CLINICAL DATA:  ORIF ankle fractures. EXAM: RIGHT ANKLE - COMPLETE 3+ VIEW COMPARISON:  None. FINDINGS: Internal plate screw fixation of the distal fibula, screw fixation of the MEDIAL malleolus and tight rope fixation changes are noted. Identified fractures are in near-anatomic alignment and position. Posterior malleolar fracture is again identified. IMPRESSION: ORIF malleolar fractures and tight rope fixation changes as described, without complicating features. Electronically Signed   By: Margarette Canada M.D.   On: 05/19/2021 15:01  DG C-Arm 1-60 Min-No Report  Result Date: 05/19/2021 Fluoroscopy was utilized by the requesting physician.  No radiographic interpretation.   DG C-Arm 1-60 Min-No Report  Result Date: 05/19/2021 Fluoroscopy was utilized by the  requesting physician.  No radiographic interpretation.     Sharion Settler, DO 05/20/2021, 2:41 AM PGY-2, [place] Intern pager: [phone], text pages welcome

## 2021-05-20 NOTE — Evaluation (Signed)
Physical Therapy Evaluation Patient Details Name: Debra Howell MRN: [id number] DOB: December 06, 1985 Today's Date: 05/20/2021  History of Present Illness  The pt is a 36 yo female presenting 2/1 with hyperglycemia and trimalleolar R ankle fx (sustained in a fall related to hypoglycemic event). Pt now s/p ORIF on 2/4. PMH includes: poorly controlled Type I Diabetes.   Clinical Impression  Pt in bed upon arrival of PT, agreeable to evaluation at this time. Prior to original injury, pt independent without need for UE support or any assistance, but has been managing with WC at home since the fall while awaiting surgery. The pt now presents with limitations in functional mobility, strength, power, and endurance due to above dx and resulting NWB status, and will continue to benefit from skilled PT to address these deficits. The pt is independent with bed mobility, but benefits from RW and supervision for cues and to slow movements for safety with transfers OOB, she was limited to ~20 ft ambulation in the room due to fatigue and reports of some pain in R thigh. The pt was educated in stair navigation with use of RW, and importance of caution to avoid falls at this time to allow for healing from surgery. Pt given handouts for stair navigation to provide information to family who will be assisting at home. No further questions at this time, will be safe to return home with continued PT as well as DME as listed below.         Recommendations for follow up therapy are one component of a multi-disciplinary discharge planning process, led by the attending physician.  Recommendations may be updated based on patient status, additional functional criteria and insurance authorization.  Follow Up Recommendations Home health PT    Assistance Recommended at Discharge Frequent or constant Supervision/Assistance  Patient can return home with the following  A little help with walking and/or transfers;A little help with  bathing/dressing/bathroom;Assistance with cooking/housework;Assist for transportation;Help with stairs or ramp for entrance    Equipment Recommendations Rolling walker (2 wheels);BSC/3in1 (tub seat. all bariatric); Elevating leg rest (RLE) for WC.   Recommendations for Other Services       Functional Status Assessment Patient has had a recent decline in their functional status and demonstrates the ability to make significant improvements in function in a reasonable and predictable amount of time.     Precautions / Restrictions Precautions Precautions: Fall Required Braces or Orthoses: Splint/Cast Splint/Cast: RLE Restrictions Weight Bearing Restrictions: Yes RLE Weight Bearing: Non weight bearing Other Position/Activity Restrictions: strict NWB, elevate when possible      Mobility  Bed Mobility Overal bed mobility: Independent             General bed mobility comments: pt with HOB elevated upon arrival, able to complete with good mobility no assist    Transfers Overall transfer level: Needs assistance Equipment used: Rolling walker (2 wheels) Transfers: Sit to/from Stand, Bed to chair/wheelchair/BSC Sit to Stand: Supervision Stand pivot transfers: Supervision         General transfer comment: minG initially, cues to slow down by end of session. pt with good stability    Ambulation/Gait Ambulation/Gait assistance: Supervision Gait Distance (Feet): 20 Feet Assistive device: Rolling walker (2 wheels)   Gait velocity: decreased Gait velocity interpretation: <1.31 ft/sec, indicative of household ambulator   General Gait Details: hop-to gait pattern with NWB RLE maintained well. no overt LOB but pt stopping to rest every 3-5 hops  Stairs Stairs: Yes  General stair comments: discussed and demonstrated. pt given handouts but does have make-shift ramp in place for [facility]     Balance Overall balance assessment: Mild deficits observed, not formally tested                                            Pertinent Vitals/Pain Pain Assessment Pain Assessment: Faces Faces Pain Scale: Hurts a little bit Pain Location: R thigh with mobility Pain Descriptors / Indicators: Discomfort Pain Intervention(s): Limited activity within patient's tolerance, Monitored during session, Repositioned    Home Living Family/patient expects to be discharged to:: Private residence Living Arrangements: Spouse/significant other;Children Available Help at Discharge: Family;Available PRN/intermittently Type of Home: House Home Access: Stairs to enter Entrance Stairs-Rails: None Entrance Stairs-Number of Steps: 3   Home Layout: One level Home Equipment: Crutches;Wheelchair - manual Additional Comments: pt and her significant other put boards over stairs for ramp after original injury    Prior Function Prior Level of Function : Independent/Modified Independent             Mobility Comments: prior to original fall, pt independent without need for DME, does report a few falls due to poorly controlled blood sugar ADLs Comments: pt reports full independence     Hand Dominance        Extremity/Trunk Assessment   Upper Extremity Assessment Upper Extremity Assessment: Defer to OT evaluation    Lower Extremity Assessment Lower Extremity Assessment: RLE deficits/detail RLE Deficits / Details: able to SLR and maintain NWB, not otherwise tested due to NWB status RLE: Unable to fully assess due to immobilization RLE Sensation: WNL RLE Coordination: WNL    Cervical / Trunk Assessment Cervical / Trunk Assessment: Other exceptions Cervical / Trunk Exceptions: large body habitus  Communication   Communication: No difficulties  Cognition Arousal/Alertness: Awake/alert Behavior During Therapy: Flat affect, WFL for tasks assessed/performed Overall Cognitive Status: Within Functional Limits for tasks assessed                                  General Comments: pt originally lethargic, but able to be aroused. then with flat affect but generally following all cues and with good adherence to precautions        General Comments General comments (skin integrity, edema, etc.): VSS on RA        Assessment/Plan    PT Assessment Patient needs continued PT services  PT Problem List Decreased strength;Decreased range of motion;Decreased activity tolerance;Decreased balance;Decreased mobility       PT Treatment Interventions DME instruction;Gait training;Stair training;Functional mobility training;Therapeutic activities;Therapeutic exercise;Balance training;Patient/family education    PT Goals (Current goals can be found in the Care Plan section)  Acute Rehab PT Goals Patient Stated Goal: return home PT Goal Formulation: With patient Time For Goal Achievement: 06/03/21 Potential to Achieve Goals: Good    Frequency Min 3X/week        AM-PAC PT "6 Clicks" Mobility  Outcome Measure Help needed turning from your back to your side while in a flat bed without using bedrails?: None Help needed moving from lying on your back to sitting on the side of a flat bed without using bedrails?: None Help needed moving to and from a bed to a chair (including a wheelchair)?: A Little Help needed standing up from a chair using your  arms (e.g., wheelchair or bedside chair)?: A Little Help needed to walk in hospital room?: A Little Help needed climbing 3-5 steps with a railing? : A Lot 6 Click Score: 19    End of Session Equipment Utilized During Treatment: Gait belt Activity Tolerance: Patient tolerated treatment well Patient left: in chair;with call bell/phone within reach (with OT) Nurse Communication: Mobility status PT Visit Diagnosis: Other abnormalities of gait and mobility (R26.89)    Time: [id number] PT Time Calculation (min) (ACUTE ONLY): 28 min   Charges:   PT Evaluation $PT Eval Low Complexity: 1 Low PT  Treatments $Gait Training: 8-22 mins        [place] Carbo, PT, DPT   Acute Rehabilitation Department Pager #: [phone]  Sandra Cockayne 05/20/2021, 8:54 AM

## 2021-05-20 NOTE — Progress Notes (Signed)
Inpatient Diabetes Program Recommendations  AACE/ADA: New Consensus Statement on Inpatient Glycemic Control (2015)  Target Ranges:  Prepandial:   less than 140 mg/dL      Peak postprandial:   less than 180 mg/dL (1-2 hours)      Critically ill patients:  140 - 180 mg/dL   Lab Results  Component Value Date   GLUCAP 274 (H) 05/20/2021   HGBA1C 7.9 (H) 05/15/2021    Review of Glycemic Control  Latest Reference Range & Units 05/19/21 19:42 05/20/21 01:45 05/20/21 08:11 05/20/21 11:34  Glucose-Capillary 70 - 99 mg/dL 474 (H) 259 (H) 563 (H) 274 (H)  (H): Data is abnormally high  Diabetes history: DM1  Current orders for Inpatient glycemic control: Semglee 20 units, Novolog 0-9 units TID & 0-5 units QHS, 6 units mctid  Inpatient Diabetes Program Recommendations:    Semglee 24 units QHS  Will continue to follow while inpatient.  Thank you, Dulce Sellar, MSN, RN Diabetes Coordinator Inpatient Diabetes Program [phone] (team pager from 8a-5p)

## 2021-05-20 NOTE — Discharge Summary (Signed)
Family Medicine Teaching [facility] Discharge Summary  Patient name: Debra Howell Medical record number: [id number] Date of birth: 1985-10-19 Age: 36 y.o. Gender: female Date of Admission: 05/15/2021  Date of Discharge: 05/20/21 Admitting Physician: Sabino Dick, DO  Primary Care Provider: Pcp, No Consultants: Orthopedic surgery   Indication for Hospitalization: Poorly controlled Type 1 DM with hyperglycemia, trimalleolar fracture  Discharge Diagnoses/Problem List:  Principal Problem:   Trimalleolar fracture Active Problems:   Surgery, elective   Hyponatremia   Disposition: Home with home health PT  Discharge Condition: Stable   Discharge Exam:   Physical Exam: General: Pleasant, NAD, cooperative Cardiovascular: RRR without murmur Respiratory: CTAB without wheezing/rhonchi/rales Abdomen: Obese, non-tender, non-distended, soft  Extremities: RLE is short leg splint, elevated above bed  Physical exam performed by Dr. Melba Coon on day of discharge   Brief Hospital Course:  Debra Howell is a 36 y.o. female who was admitted to the [facility] Medicine Teaching Service at [facility] for an acute fall from standing due to an episode of hypoglycemia. Hospital course is outlined below by system.   Trimalleolar Fracture s/p ground-level fall  Stable, closed fracture after fall from standing that occurred on 1/25. Was seen at [facility] previously. She had outpatient follow up with ortho prior to admission. Was recommended for medical admission prior to surgery given her reported labile blood sugars at home. In the ED, had CT of her ankle which confirmed trimalleolar fracture. Pain was controlled with scheduled tylenol and oxy 5 mg as needed for breakthrough pain. She had ORIF, right ankle syndesmosis fixation x2, right ankle deltoid ligament repair, and right ankle arthrotomy with irrigation and removal of interposed tissue and loose bodies on 2/4 without complication. PT/OT  recommendations at time of discharge were home health PT which was ordered prior to discharge. Patient instructed to take aspirin bid for 30 days following surgery, scheduled to end on 3/4 and to maintain orthopedic outpatient follow up.   Type 1 DM  Patient presented to the ED after fall due to reported hypoglycemia in 40s at home. She had not been followed by PCP and had been self-managing her diabetes with only basal Humalog. On admission, CBG >200, Hgb A1c 7.9. She was started on basal, bolus and mealtime insulin.  She received extensive diabetes education by the diabetes coordinators. She had no episodes of hypoglycemia while inpatient. She was discharged with prescription for Lantus, Novolog, FreeStyle Libre 2 CGM and reader, insulin pen needles.  Insulin regimen at time of discharge was Semglee 20U and Novolog 6U TID with meals.   RESP/CV: The patient remained hemodynamically stable throughout the hospitalization    FEN/GI: Maintenance IV fluids were continued throughout hospitalization. At the time of discharge, the patient was tolerating PO off IV fluids.    Issues for Follow Up:  Ortho recommendations for DVT prophylaxis: Aspirin 325 mg twice daily x 30 days s/p surgery (ending 3/4) Should follow up with Ortho in 7-10 days for a splint f/u; sutures out in 2-3 weeks per ortho, will change short leg splint to short leg cast at that time  Repeat Hgb A1c in May and adjust diabetic regimen appropriately. Consider repeating CBG at next follow up.  Overdue for diabetic eye examination Continue diabetes education and adjust insulin as needed; consider referral to endocrinology  Patient to establish care at [facility] with Dr. Miquel Dunn, please ensure she maintains routine outpatient follow up.   Significant Procedures:   2/4: PROCEDURE: Right trimalleolar ankle fracture ORIF without fixation of posterior lip  Right ankle syndesmosis fixation x 2 Right ankle arthrotomy with irrigation and removal of  interposed tissue and loose bodies Right ankle deltoid ligament repair  Significant Labs and Imaging:  Recent Labs  Lab 05/17/21 0454 05/19/21 0541 05/20/21 0145  WBC 9.3 10.8* 13.3*  HGB 13.0 12.6 12.0  HCT 39.5 39.1 37.2  PLT 428* 526* 516*   Recent Labs  Lab 05/15/21 1714 05/17/21 0454 05/18/21 0307 05/19/21 0541 05/20/21 0145  NA 134* 132* 134* 133* 136  K 5.0 4.4 3.9 4.0 3.7  CL 101 100 101 97* 100  CO2 23 21* 23 24 24   GLUCOSE 258* 337* 189* 298* 129*  BUN 14 9 12 11 8   CREATININE 0.75 0.78 0.66 0.74 0.68  CALCIUM 8.5* 8.5* 8.5* 8.9 8.5*    Results/Tests Pending at Time of Discharge:  Unresulted Labs (From admission, onward)     Start     Ordered   05/15/21 1653  Urinalysis, Routine w reflex microscopic  Once,   STAT        05/15/21 1653             Discharge Medications:  Allergies as of 05/20/2021   No Known Allergies      Medication List     STOP taking these medications    insulin regular 100 units/mL injection Commonly known as: NOVOLIN R   Percocet 5-325 MG tablet Generic drug: oxyCODONE-acetaminophen       TAKE these medications    aspirin 325 MG tablet Take 1 tablet (325 mg total) by mouth 2 (two) times daily.   FreeStyle Libre 2 Reader [employer] 1 each by Does not apply route with breakfast, with lunch, and with evening meal.   FreeStyle Libre 2 Sensor Misc 1 each by Does not apply route 4 (four) times daily - after meals and at bedtime.   insulin glargine 100 UNIT/ML Solostar Pen Commonly known as: LANTUS Inject 20 Units into the skin daily.   Insulin Pen Needle 32G X 4 MM Misc 100 each by Does not apply route as needed.   NovoLOG FlexPen 100 UNIT/ML FlexPen Generic drug: insulin aspart Inject 6 Units into the skin 3 (three) times daily with meals.   oxyCODONE 5 MG immediate release tablet Commonly known as: Oxy IR/ROXICODONE Take 1 tablet (5 mg total) by mouth every 4 (four) hours as needed for up to 7 days for  moderate pain or severe pain.   polyethylene glycol 17 g packet Commonly known as: MIRALAX / GLYCOLAX Take 17 g by mouth daily. Start taking on: May 21, 2021               Durable Medical Equipment  (From admission, onward)           Start     Ordered   05/20/21 0959  For home use only DME 3 n 1  Once       Comments: Bariatric HD   05/20/21 0959   05/20/21 0959  For home use only DME Tub bench  Once       Comments: backless   05/20/21 0959   05/20/21 0958  For home use only DME Walker rolling  Once       Comments: Bariatric HD  Question Answer Comment  Walker: With 5 Inch Wheels   Patient needs a walker to treat with the following condition Decreased functional mobility and endurance      05/20/21 0959  Discharge Instructions: Please refer to Patient Instructions section of EMR for full details.  Patient was counseled important signs and symptoms that should prompt return to medical care, changes in medications, dietary instructions, activity restrictions, and follow up appointments.   Follow-Up Appointments:  Follow-up Information     Pray, Milus Mallick, MD. Call.   Specialty: Family Medicine Why: Please go to your appointment at 9:50 am with Dr. Miquel Dunn, please arrive 15 minutes prior to your schedule appointment time. Contact information: [address] [place] [address] [phone]         Netta Cedars, MD. Call.   Specialty: Orthopedic Surgery Why: Call to schedule an appointment to be seen within the next 7-10 days. Contact information: [address] [place] [address] [phone]                 Reece Leader, DO 05/20/2021, 2:59 PM PGY-2, [facility] Family Medicine

## 2021-05-20 NOTE — Progress Notes (Signed)
° ° °  Subjective: 1 Day Post-Op Procedure(s) (LRB): OPEN REDUCTION INTERNAL FIXATION (ORIF) RIGHT ANKLE FRACTURE, SYNDESMOSIS/DELTOID FIXATION, REMOVAL OF LOOSE BODY (Right) Patient reports pain as 2 on 0-10 scale.   Denies CP or SOB.  Voiding without difficulty. Positive flatus. Objective: Vital signs in last 24 hours: Temp:  [97.8 F (36.6 C)-98.6 F (37 C)] 97.9 F (36.6 C) (02/05 0814) Pulse Rate:  [67-109] 82 (02/05 0814) Resp:  [9-39] 16 (02/05 0814) BP: (98-205)/(62-192) 101/62 (02/05 0814) SpO2:  [88 %-100 %] 97 % (02/05 0814)  Intake/Output from previous day: 02/04 0701 - 02/05 0700 In: 1107.3 [I.V.:957.3; IV Piggyback:150] Out: 20 [Blood:20] Intake/Output this shift: Total I/O In: 50 [IV Piggyback:50] Out: -   Labs: Recent Labs    05/19/21 0541 05/20/21 0145  HGB 12.6 12.0   Recent Labs    05/19/21 0541 05/20/21 0145  WBC 10.8* 13.3*  RBC 4.02 3.77*  HCT 39.1 37.2  PLT 526* 516*   Recent Labs    05/19/21 0541 05/20/21 0145  NA 133* 136  K 4.0 3.7  CL 97* 100  CO2 24 24  BUN 11 8  CREATININE 0.74 0.68  GLUCOSE 298* 129*  CALCIUM 8.9 8.5*   No results for input(s): LABPT, INR in the last 72 hours.  Physical Exam: Neurologically intact ABD soft Compartment soft Body mass index is 37.65 kg/m. Splint in good condition Cap. Refill < 2 seconds    Assessment/Plan: 1 Day Post-Op Procedure(s) (LRB): OPEN REDUCTION INTERNAL FIXATION (ORIF) RIGHT ANKLE FRACTURE, SYNDESMOSIS/DELTOID FIXATION, REMOVAL OF LOOSE BODY (Right) Advance diet Up with therapy Plan on d/c to home if cleared by medical team. NWB with walker follow up in 7-10 days for wound check and splint change sutures out in 2-3 weeks with exchange of short leg splint to short leg cast in outpatient office  Alvy Beal for Dr. Venita Lick [facility] [phone] 05/20/2021, 9:32 AM

## 2021-05-20 NOTE — Evaluation (Signed)
Occupational Therapy Evaluation Patient Details Name: Debra Howell MRN: [id number] DOB: [birth year] Today's Date: 05/20/2021   History of Present Illness The pt is a 36 yo female presenting 2/1 with hyperglycemia and trimalleolar R ankle fx (sustained in a fall related to hypoglycemic event). Pt now s/p ORIF on 2/4. PMH includes: poorly controlled Type I Diabetes.   Clinical Impression   PTA pt lives with her boyfriend and 2 children (8&11 yo) and was independent with mobility and ADL tasks. Educated pt on functional mobility and compensatory techniques for ADL tasks to maximize independence and reduce risk of falls. Briefly discussed management of diabetes and recommended pt discuss possible use of Freestyle Libre with diabetes coordinator/her primary care MD. Recommend equipment listed below to facilitate safe DC home with assistance of her boyfriend (pt states her boyfriend does not work and can provide 24/7 A if needed)..      Recommendations for follow up therapy are one component of a multi-disciplinary discharge planning process, led by the attending physician.  Recommendations may be updated based on patient status, additional functional criteria and insurance authorization.   Follow Up Recommendations  No OT follow up    Assistance Recommended at Discharge Intermittent Supervision/Assistance  Patient can return home with the following A lot of help with walking and/or transfers;A little help with bathing/dressing/bathroom;Assist for transportation;Help with stairs or ramp for entrance    Functional Status Assessment  Patient has had a recent decline in their functional status and demonstrates the ability to make significant improvements in function in a reasonable and predictable amount of time.  Equipment Recommendations  BSC/3in1;Tub/shower seat (tub seat without back)    Recommendations for Other Services       Precautions / Restrictions Precautions Precautions: Fall Required  Braces or Orthoses: Splint/Cast Splint/Cast: RLE Splint/Cast - Date Prophylactic Dressing Applied (if applicable): 05/19/21 Restrictions Weight Bearing Restrictions: Yes RLE Weight Bearing: Non weight bearing Other Position/Activity Restrictions: strict NWB, elevate when possible      Mobility Bed Mobility Overal bed mobility: Independent - OOB in chair                Transfers Overall transfer level: Needs assistance Equipment used: Rolling walker (2 wheels) Transfers: Sit to/from Stand, Bed to chair/wheelchair/BSC Sit to Stand: Supervision Stand pivot transfers: Supervision                Balance Overall balance assessment: Mild deficits observed, not formally tested                                         ADL either performed or assessed with clinical judgement   ADL Overall ADL's : Needs assistance/impaired                                     Functional mobility during ADLs: Supervision/safety;Rolling walker (2 wheels) General ADL Comments: Educated on use of reacher to help with LB dressing; Educated on use of 3in1 for toilet transfers adn tub transfer technique by sliding from toilet to tub ledge onto tub seat without back with R leg out of tubpropped up/use of shower curtain/trash bag to prevent leg from getting set. Educated on removing throw rugs to reduce risk of falls;issued reacher.     Vision         Perception  Praxis      Pertinent Vitals/Pain Pain Assessment Pain Assessment: 0-10 Pain Score: 7  Pain Location: RLE Pain Descriptors / Indicators: Discomfort, Sharp Pain Intervention(s): Limited activity within patient's tolerance     Hand Dominance     Extremity/Trunk Assessment Upper Extremity Assessment Upper Extremity Assessment: Overall WFL for tasks assessed   Lower Extremity Assessment Lower Extremity Assessment: Defer to PT evaluation RLE Deficits / Details: able to SLR and maintain NWB, not  otherwise tested due to NWB status RLE: Unable to fully assess due to immobilization RLE Sensation: WNL RLE Coordination: WNL   Cervical / Trunk Assessment Cervical / Trunk Assessment: Other exceptions Cervical / Trunk Exceptions: large body habitus   Communication Communication Communication: No difficulties   Cognition Arousal/Alertness: Awake/alert Behavior During Therapy: Flat affect, WFL for tasks assessed/performed Overall Cognitive Status: Within Functional Limits for tasks assessed                                       General Comments  VSS on RA    Exercises     Shoulder Instructions      Home Living Family/patient expects to be discharged to:: Private residence Living Arrangements: Spouse/significant other;Children Available Help at Discharge: Family;Available PRN/intermittently Type of Home: House Home Access: Stairs to enter [employer] of Steps: 3 Entrance Stairs-Rails: None Home Layout: One level     Bathroom Shower/Tub: Chief Strategy Officer: Standard Bathroom Accessibility: Yes How Accessible: Accessible via walker (sideways) Home Equipment: Crutches;Wheelchair - manual   Additional Comments: pt and her significant other put boards over stairs for ramp after original injury      Prior Functioning/Environment Prior Level of Function : Independent/Modified Independent             Mobility Comments: prior to original fall, pt independent without need for DME, does report a few falls due to poorly controlled blood sugar ADLs Comments: pt reports full independence        OT Problem List: Decreased strength;Decreased range of motion;Decreased activity tolerance;Decreased knowledge of use of DME or AE;Obesity;Pain      OT Treatment/Interventions:      OT Goals(Current goals can be found in the care plan section) Acute Rehab OT Goals Patient Stated Goal: to go home OT Goal Formulation: All assessment and  education complete, DC therapy  OT Frequency:      Co-evaluation              AM-PAC OT "6 Clicks" Daily Activity     Outcome Measure Help from another person eating meals?: None Help from another person taking care of personal grooming?: A Little Help from another person toileting, which includes using toliet, bedpan, or urinal?: A Little Help from another person bathing (including washing, rinsing, drying)?: A Little Help from another person to put on and taking off regular upper body clothing?: A Little Help from another person to put on and taking off regular lower body clothing?: A Little 6 Click Score: 19   End of Session Equipment Utilized During Treatment: Gait belt;Rolling walker (2 wheels) Nurse Communication: Mobility status;Other (comment) (DC needs)  Activity Tolerance: Patient tolerated treatment well Patient left: in chair;with call bell/phone within reach  OT Visit Diagnosis: Unsteadiness on feet (R26.81);History of falling (Z91.81);Pain Pain - Right/Left: Right Pain - part of body: Leg  Time: 4503-8882 OT Time Calculation (min): 20 min Charges:  OT General Charges $OT Visit: 1 Visit OT Evaluation $OT Eval Low Complexity: 1 Low  Luisa Dago, OT/L   Acute OT Clinical Specialist Acute Rehabilitation Services Pager [phone] Office [phone]   [facility] 05/20/2021, 9:43 AM

## 2021-05-21 ENCOUNTER — Encounter (HOSPITAL_COMMUNITY): Payer: Self-pay | Admitting: Orthopaedic Surgery

## 2021-05-21 ENCOUNTER — Telehealth: Payer: Self-pay

## 2021-05-21 ENCOUNTER — Other Ambulatory Visit (HOSPITAL_COMMUNITY): Payer: Self-pay

## 2021-05-21 NOTE — Progress Notes (Signed)
° ° °  SUBJECTIVE:   CHIEF COMPLAINT / HPI:   Hospital f/u-   Trimalleolar fracture- Aspirin 325 mg BID x 30 days until 3/4, follow up with Ortho in 7-10 days for a splint f/u; sutures out in 2-3 weeks per ortho, will change short leg splint to short leg cast at that time. She has ortho follow up next week on 05/29/21. Pain well controlled, not needing oxycodone often. No chest pain or shortness of breath. Taking the BID aspirin. Able to get around house with walker, wheelchair, bath and potty chairs.  T1DM- taking 10 units lantus BID, 3-6 units Novolog TIDAC depending on how much she eats.Has libre sensor but issues with getting reader from pharmacy. Fasting glucoses in 200s and postprandial in 100s.  PERTINENT  PMH / PSH: T1DM  OBJECTIVE:   BP 118/87    Pulse 85    Ht 5\' 4"  (1.626 m)    Wt 220 lb 6.4 oz (100 kg)    LMP 05/15/2021    SpO2 99%    BMI 37.83 kg/m   General: alert & oriented, no apparent distress, well groomed, sitting in wheelchair HEENT: normocephalic, atraumatic, EOM grossly intact, oral mucosa moist, neck supple Cardiac: RRR Respiratory: normal respiratory effort, lungs CTAB GI: non-distended Extremities: right leg in cast/boot Skin: no rashes, no jaundice Psych: appropriate mood and affect   ASSESSMENT/PLAN:   Type 1 diabetes mellitus with other specified complication (HCC) - continue home regimen, appt next week with pharmacy to go over and get sensor set up, no hypoglycemic symptoms, home sugars with much improved control - will send message to pharmacy team to help address medication cost  Trimalleolar fracture - has follow up with ortho next week, continue with BID aspirin for DVT ppx   Declined flu vaccine and COVID vaccines.  F/u in 2-4 weeks for annual wellness, pap smear, and catch up on healthcare maintenance.  05/17/2021, MD [facility]

## 2021-05-21 NOTE — Telephone Encounter (Signed)
A Prior Authorization was initiated for this patients FREESTYLE LIBRE 2 SENSOR through CoverMyMeds.   Key: [id number]

## 2021-05-22 NOTE — Telephone Encounter (Signed)
Prior Auth for patients medication FREESTYLE LIBRE 2 SENSOR denied by [facility] (MEDICAID) via CoverMyMeds.   Reason: The beneficiary has not had a face-to-face encounter with the treating practitioner to evaluate the beneficiarys glycemic control within six months of the initial authorization request  CoverMyMeds Key: [id number]   Was this pt seen in the hospital or in office? I couldn't find any supporting documents to send!

## 2021-05-25 ENCOUNTER — Encounter: Payer: Self-pay | Admitting: Family Medicine

## 2021-05-25 ENCOUNTER — Other Ambulatory Visit: Payer: Self-pay

## 2021-05-25 ENCOUNTER — Ambulatory Visit (INDEPENDENT_AMBULATORY_CARE_PROVIDER_SITE_OTHER): Payer: Medicaid Other | Admitting: Family Medicine

## 2021-05-25 DIAGNOSIS — E1069 Type 1 diabetes mellitus with other specified complication: Secondary | ICD-10-CM

## 2021-05-25 DIAGNOSIS — S82851A Displaced trimalleolar fracture of right lower leg, initial encounter for closed fracture: Secondary | ICD-10-CM | POA: Diagnosis not present

## 2021-05-25 NOTE — Patient Instructions (Signed)
It was wonderful to see you today.  Please bring ALL of your medications with you to every visit.   Today we talked about:  - F/u with our pharmacy team scheduled   Thank you for choosing [facility].   Please call [phone] with any questions about today's appointment.  Please be sure to schedule follow up at the front  desk before you leave today.   Please arrive at least 15 minutes prior to your scheduled appointments.   If you had blood work today, I will send you a MyChart message or a letter if results are normal. Otherwise, I will give you a call.   If you had a referral placed, they will call you to set up an appointment. Please give [place] a call if you don't hear back in the next 2 weeks.   If you need additional refills before your next appointment, please call your pharmacy first.   Burley Saver, MD  Family Medicine

## 2021-05-25 NOTE — Assessment & Plan Note (Signed)
-   has follow up with ortho next week, continue with BID aspirin for DVT ppx

## 2021-05-25 NOTE — Assessment & Plan Note (Signed)
-   continue home regimen, appt next week with pharmacy to go over and get sensor set up, no hypoglycemic symptoms, home sugars with much improved control - will send message to pharmacy team to help address medication cost

## 2021-05-29 ENCOUNTER — Other Ambulatory Visit: Payer: Self-pay

## 2021-05-29 ENCOUNTER — Ambulatory Visit (INDEPENDENT_AMBULATORY_CARE_PROVIDER_SITE_OTHER): Payer: Medicaid Other | Admitting: Pharmacist

## 2021-05-29 DIAGNOSIS — E1069 Type 1 diabetes mellitus with other specified complication: Secondary | ICD-10-CM | POA: Diagnosis not present

## 2021-05-29 NOTE — Assessment & Plan Note (Signed)
Diabetes longstanding currently uncontrolled. Patient is able to verbalize appropriate hypoglycemia management plan. Diabetes medication adherence reported as optimal . Control is suboptimal due to symptomatic lows creating fall. -Continued basal insulin Lantus (insulin glargine) 10 units BID -Continued  rapid insulin Novolog (insulin aspart) 6 units TID; patient will increase to 10 units if high BG readings are seen.  -Extensively discussed pathophysiology of diabetes, recommended lifestyle interventions, dietary effects on blood sugar control -Counseled on s/sx of and management of hypoglycemia -Next A1C anticipated April 2023.

## 2021-05-29 NOTE — Patient Instructions (Addendum)
It was nice to see you today.  Make sure to charge the CGM [facility] reader every day. If you see a low, make sure you follow up with your regular reader.  It is also important to swipe the meter at least three times a day: when you wake up, in the middle of the day, and when you go to bed.  The sensor will be replaced every 14 days. Please bring your sensor to your next appointment.   Continue your Lantus and Novolog.

## 2021-05-29 NOTE — Progress Notes (Signed)
Reviewed: I agree with Dr. Koval's documentation and management. 

## 2021-05-29 NOTE — Progress Notes (Signed)
S:     Debra Howell is a 36 y.o. female who presents for for diabetes management and Libre 2.0 application PMH is significant for insulin pumps for Type 1 DM. Patient was referred and last seen by Primary Care Provider, Dr. Thompson Grayer, on 05/25/21.  Today, She arrives in good spirits and presents with assistance of her mother as she is ambulating with use of a wheel chair due to injury.  Insurance coverage/medication affordability: [place] [employer]  Patient reports diabetes was diagnosed in 2001.   Patient-reported BG readings: average 200s; highest 300s Patient denies hypoglycemic events.  Patient reports adherence with medications.  Current diabetes medications include: Lantus 10 units BID; Novolog 6 units TID. -Patient has been using Novolog 10 units in the morning as she has been really high in the mornings.  Patient reported dietary habits: Eats 3 meals/day Breakfast: egg and sausage biscuit, Special K cereal Lunch: sandwich (chicken salad) Dinner: pizza, hamburger helper Beverages: water  Patient-reported exercise habits: none due to current injury  Freestyle Libre 2.0 patient education Instruction: [facility] patient education  CGM overview and set-up 1. Button, touch screen, and icons 2. Power supply and recharging 3. Home screen 4. Date and time 5. Set BG target range: 70-350 mg/dL 6. Set alarm/alert tone  7. Interstitial vs. capillary blood glucose readings  8. When to verify sensor reading with fingerstick blood glucose  Sensor application -- sensor placed on Left Arm 1. Site selection and site prep with alcohol pad 2. Sensor prep-sensor pack and sensor applicator 3. Starting the sensor: 1 hour warm up before BG readings available     Will ask for fingersticks the first 12 hours   4. Sensor change every 14 days and rotate site 5. Call [employer] customer service if sensor comes off before 14 days  Safety and Troubleshooting 1. Scan the sensor at least  every 8 hours 2. When the "test BG" symbol appears, test fingerstick blood glucose prior to    making treatment decisions 3. Do a fingerstick blood glucose test if the sensor readings do not match how    you feel 4. Remove sensor prior for MRI or CT. Sensor may be damaged by exposure to    airport x-ray screening 5. Vitamin C may cause false high readings and aspirin may cause false low     readings 6. Store sensor kit between 39 and 77 degrees. Can be refrigerated at this temp.  Contact information provided for [employer] customer service and/or trainer.  Freestyle Libre 2 sensor: Code: [id number] Exp: 11/12/2021  O:  Vitals:   05/29/21 1124  Pulse: (!) 108  SpO2: 93%    Lab Results  Component Value Date   HGBA1C 7.9 (H) 05/15/2021   HGBA1C 8.6 (H) 11/23/2008   Assessment: Diabetes longstanding currently uncontrolled. Patient is able to verbalize appropriate hypoglycemia management plan. Diabetes medication adherence reported as optimal . Control is suboptimal due to symptomatic lows creating fall. -Continued basal insulin Lantus (insulin glargine) 10 units BID -Continued  rapid insulin Novolog (insulin aspart) 6 units TID; patient will increase to 10 units if high BG readings are seen.  -Extensively discussed pathophysiology of diabetes, recommended lifestyle interventions, dietary effects on blood sugar control -Counseled on s/sx of and management of hypoglycemia -Next A1C anticipated April 2023.   Patient expected to follow up in the next two weeks with pharmacy team for diabetes management  This appointment required 40 minutes of patient care (this includes precharting, chart review, review of results,  face-to-face care, etc.).  Patient seen with Gala Murdoch, PharmD Candidate.

## 2021-06-11 ENCOUNTER — Ambulatory Visit: Payer: Medicaid Other | Admitting: Pharmacist

## 2021-06-12 ENCOUNTER — Ambulatory Visit (INDEPENDENT_AMBULATORY_CARE_PROVIDER_SITE_OTHER): Payer: Medicaid Other | Admitting: Pharmacist

## 2021-06-12 ENCOUNTER — Other Ambulatory Visit: Payer: Self-pay

## 2021-06-12 DIAGNOSIS — E1069 Type 1 diabetes mellitus with other specified complication: Secondary | ICD-10-CM | POA: Diagnosis not present

## 2021-06-12 MED ORDER — INSULIN GLARGINE 100 UNIT/ML SOLOSTAR PEN: 9.0000 [IU] | PEN_INJECTOR | Freq: Two times a day (BID) | SUBCUTANEOUS | 11 refills | Status: DC

## 2021-06-12 MED ORDER — NOVOLOG FLEXPEN 100 UNIT/ML ~~LOC~~ SOPN: 8.0000 [IU] | PEN_INJECTOR | Freq: Two times a day (BID) | SUBCUTANEOUS | 11 refills | Status: DC

## 2021-06-12 NOTE — Patient Instructions (Addendum)
It was nice to see you today.   Decrease long-acting insulin (Lantus) to 9 units twice a day.  Increase meal time insulin (Novolog) to 8-12 units with meals.  Please come back to see [place] in a month. Bring your CGM with you to your appointment.  Freestyle Libre 2.0 patient education Instruction: [employer] 14-day Freestyle Libre patient education   CGM overview and set-up 1. Button, touch screen, and icons 2. Power supply and recharging 3. Home screen 4. Date and time 5. Set BG target range: 70-350 mg/dL 6. Set alarm/alert tone  7. Interstitial vs. capillary blood glucose readings  8. When to verify sensor reading with fingerstick blood glucose   Sensor application -- sensor placed on Left Arm 1. Site selection and site prep with alcohol pad 2. Sensor prep-sensor pack and sensor applicator 3. Starting the sensor: 1 hour warm up before BG readings available     Will ask for fingersticks the first 12 hours   4. Sensor change every 14 days and rotate site 5. Call [employer] customer service if sensor comes off before 14 days   Safety and Troubleshooting 1. Scan the sensor at least every 8 hours 2. When the "test BG" symbol appears, test fingerstick blood glucose prior to    making treatment decisions 3. Do a fingerstick blood glucose test if the sensor readings do not match how    you feel 4. Remove sensor prior for MRI or CT. Sensor may be damaged by exposure to    airport x-ray screening 5. Vitamin C may cause false high readings and aspirin may cause false low     readings 6. Store sensor kit between 39 and 77 degrees. Can be refrigerated at this temp.   Contact information for [employer]: [phone]

## 2021-06-12 NOTE — Progress Notes (Signed)
° ° °  S:     Chief Complaint  Patient presents with   Medication Management    Diabetes Management    Debra Howell is a 36 y.o. female who presents for diabetes evaluation, education, and management. PMH is significant for T1DM. Patient was referred and last seen by Primary Care Provider, Dr. Thompson Howell, on 05/25/21. Patient last saw the pharmacy team on 05/29/2021. At last visit, discussed insulin regimen and how to use [employer].   Today, She arrives in good spirits and presents with assistance of her mother. She is currently using a wheel chair due to recent broken ankle.  Current diabetes medications include: Novolog (insulin aspart) 10 units with meals, Lantus (insulin glargine) 10 units BID. Current hypertension medications include: none Current hyperlipidemia medications include: none  Patient states that She is taking her medications as prescribed. Patient reports adherence with medications.  Insurance coverage: [place] Medicaid  Patient reports hypoglycemic events. Mostly at nighttime.  Patient has a CGM: -Average Glucose 199 mg/dL -GMI 8.1% -Very high 27% -High 28% -Target 44% -Low 1%  Patient denies nocturia (nighttime urination).  No nocturnal awakenings for urination.   Patient reported dietary habits: Eats 2 meals/day Lunch: sandwich (chicken salad) Dinner: Biggest meal - pizza, hamburger helper Drinks: water, occasional diet soda  Patient-reported exercise habits: none due to current injury  O:  Physical Exam Constitutional:      Appearance: Normal appearance.  Neurological:     Mental Status: She is alert.  Psychiatric:        Mood and Affect: Mood normal.        Behavior: Behavior normal.        Thought Content: Thought content normal.   Review of Systems  All other systems reviewed and are negative.  Lab Results  Component Value Date   HGBA1C 7.9 (H) 05/15/2021   Vitals:   06/12/21 1054  Pulse: 99  SpO2: 98%    A/P: Diabetes longstanding  currently uncontrolled however markedly improved. [place] Elenor Legato has improved control, minimizing low/very low readings. Diabetes medication adherence reported as optimal . Patient is able to verbalize appropriate hypoglycemia management plan.Control is suboptimal due to symptomatic lows creating fall. Patient likely to be ambulatory in the next 4-8 weeks following ankle surgery which should help improve glycemic control.  -Change basal insulin Lantus (insulin glargine) from 10 to 9 units BID -Change rapid insulin Novolog (insulin aspart) from 6-10  to 8-12 units with meals -Extensively discussed pathophysiology of diabetes, recommended lifestyle interventions, dietary effects on blood sugar control -Counseled on s/sx of and management of hypoglycemia -Next A1C anticipated April 2023.   Written patient instructions provided. Patient verbalized understanding of treatment plan. Total time in face to face counseling 30 minutes.    Follow up pharmacist clinic visit at the end of March. Patient seen with Debra Howell, PharmD Candidate. Marland Kitchen

## 2021-06-12 NOTE — Assessment & Plan Note (Signed)
Diabetes longstanding currently uncontrolled however markedly improved. Debra Howell has improved control, minimizing low/very low readings. Diabetes medication adherence reported as optimal . Patient is able to verbalize appropriate hypoglycemia management plan.Control is suboptimal due to symptomatic lows creating fall. Patient likely to be ambulatory in the next 4-8 weeks following ankle surgery which should help improve glycemic control.  -Change basal insulin Lantus (insulin glargine) from 10 to 9 units BID -Change rapid insulin Novolog (insulin aspart) from 6-10  to 8-12 units with meals -Extensively discussed pathophysiology of diabetes, recommended lifestyle interventions, dietary effects on blood sugar control -Counseled on s/sx of and management of hypoglycemia -Next A1C anticipated April 2023.

## 2021-06-12 NOTE — Progress Notes (Signed)
Reviewed: I agree with Dr. Koval's documentation and management. 

## 2021-06-26 ENCOUNTER — Telehealth: Payer: Self-pay

## 2021-06-26 ENCOUNTER — Other Ambulatory Visit (HOSPITAL_COMMUNITY): Payer: Self-pay

## 2021-06-26 ENCOUNTER — Telehealth: Payer: Self-pay | Admitting: Family Medicine

## 2021-06-26 NOTE — Telephone Encounter (Signed)
A Prior Authorization was initiated for this patients FREESTYLE LIBRE 2 READER through CoverMyMeds.  ? ?ATTACHED CHART NOTES FROM 06/12/21. ? ?Key: [id number] ? ?

## 2021-06-26 NOTE — Telephone Encounter (Signed)
Patient came in stating that she was told by her insurance that they will not cover her insulin until April, and she is almost out. She would like to talk to someone to see what she needs to do about getting it. ?

## 2021-06-26 NOTE — Telephone Encounter (Signed)
Prior Auth for patients medication FREESTYLE LIBRE 2 READER approved by [facility] (MEDICAID) from 06/25/21 to 12/27/21. ? ?Key: [id number] ? ?Patients pharmacy notified. ? ?Approval letter scanned to chart ?

## 2021-06-27 NOTE — Telephone Encounter (Signed)
Spoke to pt's pharmacy. Pt picked up both Lantus & Novolog on 05/20/21. Lantus was filled for a 68 day supply. Novolog was filled for a 80 day supply.  ?

## 2021-07-10 ENCOUNTER — Other Ambulatory Visit: Payer: Self-pay

## 2021-07-10 ENCOUNTER — Other Ambulatory Visit (HOSPITAL_COMMUNITY): Payer: Self-pay

## 2021-07-10 ENCOUNTER — Ambulatory Visit (INDEPENDENT_AMBULATORY_CARE_PROVIDER_SITE_OTHER): Payer: Medicaid Other | Admitting: Pharmacist

## 2021-07-10 DIAGNOSIS — E1069 Type 1 diabetes mellitus with other specified complication: Secondary | ICD-10-CM

## 2021-07-10 DIAGNOSIS — E1065 Type 1 diabetes mellitus with hyperglycemia: Secondary | ICD-10-CM | POA: Diagnosis not present

## 2021-07-10 MED ORDER — INSULIN GLARGINE 100 UNIT/ML SOLOSTAR PEN: 12.0000 [IU] | PEN_INJECTOR | Freq: Two times a day (BID) | SUBCUTANEOUS | 11 refills | Status: DC

## 2021-07-10 MED ORDER — NOVOLOG FLEXPEN 100 UNIT/ML ~~LOC~~ SOPN: 10.0000 [IU] | PEN_INJECTOR | Freq: Two times a day (BID) | SUBCUTANEOUS | 11 refills | Status: DC

## 2021-07-10 MED ORDER — FREESTYLE LIBRE 2 SENSOR MISC: 11 refills | Status: DC

## 2021-07-10 NOTE — Progress Notes (Signed)
? ? ?S:    ? ?Chief Complaint  ?Patient presents with  ? Medication Management  ?  DM f/u  ? ? ?Debra Howell is a 36 y.o. female who presents for diabetes evaluation, education, and management. PMH is significant for T1DM. Patient was referred and last seen by Primary Care Provider, Dr. Miquel Dunn, on 05/25/21. Patient last saw the pharmacy team on 06/12/2021. Marland Kitchen At last visit, insulin regimen was assessed and changed.  ? ?Today, She arrives in okay spirits and presents with assistance of her mother. She is currently using a wheel chair due to recent broken ankle. She reports that she is going in for scans today and is hoping to get the cast changed to a boot.  ? ?Current diabetes medications include: Novolog (insulin aspart) 10-15 units twice daily with meals - reports sometimes if her blood sugar is high she will give herself another dose; Lantus (insulin glargine) 10 units BID ?Current hypertension medications include: none ?Current hyperlipidemia medications include: none ? ?Patient states that She is taking her medications as prescribed. Patient reports adherence with medications. Patient states that She misses her medications rarely. ? ?Do you feel that your medications are working for you? Yes, her medications were working very well until she began having trouble accessing her short-acting insulin due to problems with the insurance. When this occurred, she reports that she began using her old insulin again.  ?Have you been experiencing any side effects to the medications prescribed? No  ?Do you have any problems obtaining medications due to transportation or finances? Yes. She was unable to get a refill on her Novolog recently due to insurance problems.  ?Insurance coverage: [place] Medicaid  ? ?Patient reports hypoglycemic events. The most recent was last night and she reports that her blood sugar was 69. She had juice and it resolved. When her blood sugar gets low, she reports that she feels like she's going to fall over  and that she often gets shaky. She usually has juice or an oatmeal cookie and this brings her blood sugar back up.  ? ?Reported home fasting blood sugars: reports it was in the 300s this morning.   ? ?Patient reports nocturia (nighttime urination). She reports getting up once during the night occasionally. ?Patient denies neuropathy (nerve pain). She does report that when she does strenuous activities, she gets throbbing, sharp pains in her legs and feet. ?Patient denies visual changes. ?Patient reports self foot exams every time she bathes. ? ?Patient reported dietary habits: Eats 2 meals/day. She states that she normally sleeps through breakfast. ?Lunch: bowl of cereal - special K ?Dinner: depends on what her mother is cooking; pizza, beanie weenies, hamburger helper, pork chops with mashed potatoes  ?Drinks: water, occasional diet Dr. Reino Kent ? ?Within the past 12 months, did you worry whether your food would run out before you got money to buy more? No  ?Within the past 12 months, did the food you bought run out, and you didn?t have money to get more? No  ? ?Patient-reported exercise habits: none currently due to broken ankle; no exercise planned currently but she looks forward to tending to her flower bed once she gets her cast off ? ? ?O:  ?Physical Exam ?Vitals reviewed.  ?Constitutional:   ?   Appearance: Normal appearance.  ?Neurological:  ?   Mental Status: She is alert.  ?Psychiatric:     ?   Mood and Affect: Mood normal.     ?   Behavior: Behavior  normal.     ?   Thought Content: Thought content normal.     ?   Judgment: Judgment normal.  ? ? ?Review of Systems  ?All other systems reviewed and are negative. ? ?Average: 198 mg/dl ?Glucose management indicator (GMI): 8% ?Glucose variability: 43.7% ? ?Time in ranges:  ?Very high: 29% ?High: 27% ?Target range: 41% ?Low: 2% ?Very low: 1% ? ?Lab Results  ?Component Value Date  ? HGBA1C 7.9 (H) 05/15/2021  ? ?Vitals:  ? 07/10/21 1100  ?BP: 127/82  ?Pulse: 85   ?SpO2: 100%  ? ? ? ?A/P: ?Diabetes longstanding currently uncontrolled but has had improvement since starting on the [facility]. Patient is able to verbalize appropriate hypoglycemia management plan. Medication adherence appears optimal. Control is suboptimal due to symptomatic lows, not being ambulatory, variable meal intake. ?-Increased dose of basal insulin Lantus (insulin glargine)from 10 to 12 units BID ?- Changed  instructions rapid insulin Novolog (insulin aspart) to 10-15 units twice daily with meals matching current home dosing. ?-Extensively discussed pathophysiology of diabetes, recommended lifestyle interventions, dietary effects on blood sugar control.  ?-Counseled on s/sx of and management of hypoglycemia.  ?-Next A1c anticipated April 2023.  ?New CGM placed - sample utilized.  Patient new prescription for sensor was sent and prior authorization is pending.  Previously rejected due to lack of a PCP visit.  ? ?Momina Hunton has a diagnosis of diabetes, checks blood glucose readings > 4x per day, treats with 4 insulin injections, and requires frequent adjustments to insulin regimen. This patient will be seen every six months, minimally, to assess adherence to their CGM regimen and diabetes treatment plan.  ? ? ?Written patient instructions provided. Patient verbalized understanding of treatment plan. Total time in face to face counseling 34 minutes.   ? ?Follow up pharmacist clinic visit July 31, 2021. Patient seen with Bartolo Darter PharmD Candidate and Lissa Merlin, PharmD, PGY 1 pharmacy resident.  ?  ?

## 2021-07-10 NOTE — Progress Notes (Signed)
Reviewed: I agree with Dr. Koval's documentation and management. 

## 2021-07-10 NOTE — Assessment & Plan Note (Signed)
Diabetes longstanding currently uncontrolled but has had improvement since starting on the [facility]. Patient is able to verbalize appropriate hypoglycemia management plan. Medication adherence appears optimal. Control is suboptimal due to symptomatic lows, not being ambulatory, variable meal intake. ?-Increased dose of basal insulin Lantus (insulin glargine) to 12 units BID ?-Changed instructions rapid insulin Novolog (insulin aspart) to 10-15 units twice daily with meals matching current home dosing. ?-Extensively discussed pathophysiology of diabetes, recommended lifestyle interventions, dietary effects on blood sugar control.  ?-Counseled on s/sx of and management of hypoglycemia.  ?-Next A1c anticipated April 2023.  ?New CGM placed - sample utilized.  Patient new prescription for sensor was sent and prior authorization is pending.  Previously rejected due to lack of a PCP visit.  ?

## 2021-07-10 NOTE — Patient Instructions (Addendum)
Nice to see you today! ? ?Today we increased your long-acting insulin Lantus (insulin glargine) to 12 units twice daily. We corrected the instructions for your short-acting insulin Novolog (insulin aspart) to 10-15 units twice daily with meals. These will be at your pharmacy, but if you have any issues getting them, call Dr. Raymondo Band at [phone]. ? ?You received a new Libre sensor today. This will last 14 days and then you will pick up a new one at your pharmacy. Let us know if you have any issues getting this.  ?

## 2021-07-10 NOTE — Assessment & Plan Note (Signed)
Diabetes longstanding currently uncontrolled but has had improvement since starting on the Freestyle Libre. Patient is able to verbalize appropriate hypoglycemia management plan. Medication adherence appears optimal. Control is suboptimal due to symptomatic lows, not being ambulatory, variable meal intake. ?-Increased dose of basal insulin Lantus (insulin glargine) to 12 units BID ?-Changed instructions rapid insulin Novolog (insulin aspart) to 10-15 units twice daily with meals matching current home dosing. ?-Extensively discussed pathophysiology of diabetes, recommended lifestyle interventions, dietary effects on blood sugar control.  ?-Counseled on s/sx of and management of hypoglycemia.  ?-Next A1c anticipated April 2023.  ?New CGM placed - sample utilized.  Patient new prescription for sensor was sent and prior authorization is pending.  Previously rejected due to lack of a PCP visit.  ?

## 2021-07-11 ENCOUNTER — Telehealth: Payer: Self-pay

## 2021-07-11 ENCOUNTER — Telehealth: Payer: Self-pay | Admitting: Pharmacist

## 2021-07-11 NOTE — Telephone Encounter (Signed)
Patient called with concern related to CGM coming loose from skin.  ? ?She states she pushed the sensor back down and she was still registering readings.  She agreed with suggestion to apply overlay over the sensor.  ? ?No additional follow-up needed as long as she is seeing readings.  ? ?Patient verbalized understanding of treatment plan.  ?

## 2021-07-11 NOTE — Telephone Encounter (Signed)
Prior Auth for patients medication FREESTYLE LIBRE 2 SENSOR "APPEAL"  approved by [facility] MEDICAID from 07/11/21 to 01/11/22. ? ? ?Patients pharmacy notified. ?Approval letter scanned to chart ?

## 2021-07-31 ENCOUNTER — Ambulatory Visit (INDEPENDENT_AMBULATORY_CARE_PROVIDER_SITE_OTHER): Payer: Medicaid Other | Admitting: Pharmacist

## 2021-07-31 DIAGNOSIS — E1069 Type 1 diabetes mellitus with other specified complication: Secondary | ICD-10-CM | POA: Diagnosis not present

## 2021-07-31 MED ORDER — INSULIN GLARGINE 100 UNIT/ML SOLOSTAR PEN: 12.0000 [IU] | PEN_INJECTOR | Freq: Two times a day (BID) | SUBCUTANEOUS | 11 refills | Status: DC

## 2021-07-31 MED ORDER — NOVOLOG FLEXPEN 100 UNIT/ML ~~LOC~~ SOPN: 10.0000 [IU] | PEN_INJECTOR | Freq: Two times a day (BID) | SUBCUTANEOUS | 11 refills | Status: DC

## 2021-07-31 NOTE — Progress Notes (Signed)
? ? ?S:    ? ?Chief Complaint  ?Patient presents with  ? Medication Management  ?  Diabetes - insulin adjust - CGM review  ? ?Debra Howell is a 36 y.o. female who presents for diabetes evaluation, education, and management. PMH is significant for T1DM. Patient was referred and last seen by Primary Care Provider, Debra Howell, on 05/25/21. Patient last saw the pharmacy team on 07/10/21.  ? ?Today, patient arrives in good spirits and presents with assistance of her mother. She is currently in a boot on her right foot and using a wheel chair.  ? ?Current diabetes medications include: Novolog (insulin aspart) 10-15 units twice daily with meals, Lantus (insulin glargine) 12 units BID.  ? ? ?Patient reports taking all medications as prescribed. Patient reports adherence with medications. Patient reports missing her medications 0 times per week, on average. ? ?Do you feel that your medications are working for you? yes  ? ?Patient reports she enjoys the [place] because it lets her know when she is high or low. She reports she is still on the high side, which she doesn't like but reports she has not been experiencing as many lows.  ? ?Have you been experiencing any side effects to the medications prescribed? no ? ?Patient reports hypoglycemic events 1-2 times.  ? ?Patient reports managing hypoglycemic events with cookies. Patient reports her low events happen when she has gone a while without eating.  ? ?Patient denies nocturia (nighttime urination). Patient reports going to bed very late and using the restroom before going to bed around 1-2 AM. She denies having to wake from sleep to use the restroom. ?Patient denies neuropathy (nerve pain). ?Patient denies visual changes. ?Patient reports self foot exams. Patient reports calluses now on foot that is in a boot.  ? ?Patient reported dietary habits: Eats 1-2 meals/day ?Breakfast: bowl of cereal (Special K).  ?Lunch: normally eating breakfast during this time  ?Dinner: depends on what  is being cooked; lasagna, hamburger helper, pizza ?Snacks: reports not snacking throughout the day; will eat cookies for dessert ?Drinks: water, diet Debra Howell ? ?Patient-reported exercise habits: none currently due to broken ankle and still being in a boot. Patient reports swelling in this foot. ? ?For breakfast, if patient reports sugar is not high, she will give 10 units and if it is high, she will give 15 units.  ? ?Date of Download: 07/31/21 ?% Time CGM is active: 94% ?Average Glucose: 179 mg/dL ?Glucose Management Indicator: 7.6%  ?Glucose Variability: 34.8% (goal <36%) ?Time in Goal:  ?- Time in range 70-180: 55% ?- Time above range: 45% ?- Time below range: 0% ?Observed patterns: Evening meal larger than other meals during the day as sugar spikes during dinner time most days.  ? ?O:  ?Physical Exam ?Constitutional:   ?   Appearance: Normal appearance.  ?Pulmonary:  ?   Effort: Pulmonary effort is normal.  ?Neurological:  ?   Mental Status: She is alert.  ?Psychiatric:     ?   Mood and Affect: Mood normal.     ?   Behavior: Behavior normal.     ?   Thought Content: Thought content normal.     ?   Judgment: Judgment normal.  ? ? ?Review of Systems  ?All other systems reviewed and are negative. ? ? ? ?Lab Results  ?Component Value Date  ? HGBA1C 7.9 (H) 05/15/2021  ? ?Vitals:  ? 07/31/21 0903  ?BP: 117/85  ?Pulse: 84  ?SpO2: 100%  ? ? ?  A/P: ?Diabetes longstanding currently with improved control. Patient is able to verbalize appropriate hypoglycemia management plan. Medication adherence appears optimal. Control is suboptimal due to dietary indiscretion, physical inactivity, stress due to broken ankle.  CGM - Elenor Legato appears to be helping with minimizing low blood sugar readings.  ?-Continued basal insulin Lantus (insulin glargine) at 12 units BID. May plan to decrease to 10 units BID if control improves in future.  ?-Increased dose of rapid insulin Novolog (insulin aspart) to 12-18 units with evening meal.  Continued 10-15 units with morning/lunchtime meal.  ?-Extensively discussed pathophysiology of diabetes, recommended lifestyle interventions, dietary effects on blood sugar control.  ?- Consider GLP in future.   ?- Anticipate dose reduction of her long acting Lantus (insulin glargine) from 12 to 10 units BID when she becomes more mobile (boot removed).  ?-Counseled on s/sx of and management of hypoglycemia.  ?-Next A1c anticipated at next PCP visit.  ? ?Written patient instructions provided. Patient verbalized understanding of treatment plan. Total time in face to face counseling 36 minutes.   ? ?Follow up PCP clinic visit in June. Follow up with pharmacist in August. Patient seen with Earvin Hansen PharmD Candidate.  ? ?

## 2021-07-31 NOTE — Patient Instructions (Addendum)
Nice to see you today! ? ?Today we increased your dinnertime rapid insulin Novolog (insulin aspart) dose to 12-18 units. Continue taking 10-15 units with your breakfast/lunchtime meal.  ? ?Continue your Lantus (insulin glargine) at 12 units twice daily with meals. ? ?We will have you follow up with Dr. Miquel Dunn in June.  ?

## 2021-07-31 NOTE — Assessment & Plan Note (Signed)
Diabetes longstanding currently with improved control. Patient is able to verbalize appropriate hypoglycemia management plan. Medication adherence appears optimal. Control is suboptimal due to dietary indiscretion, physical inactivity, stress due to broken ankle.  CGM - Debra Howell appears to be helping with minimizing low blood sugar readings.  ?-Continued basal insulin Lantus (insulin glargine) at 12 units BID. May plan to decrease to 10 units BID if control improves in future.  ?-Increased dose of rapid insulin Novolog (insulin aspart) to 12-18 units with evening meal. Continued 10-15 units with morning/lunchtime meal.  ?-Extensively discussed pathophysiology of diabetes, recommended lifestyle interventions, dietary effects on blood sugar control.  ?- Consider GLP in future.   ?- Anticipate dose reduction of her long acting Lantus (insulin glargine) from 12 to 10 units BID when she becomes more mobile (boot removed).  ?-Counseled on s/sx of and management of hypoglycemia.  ?-Next A1c anticipated at next PCP visit.  ?

## 2021-08-02 NOTE — Progress Notes (Signed)
Reviewed: I agree with Dr. Koval's documentation and management. 

## 2021-08-07 ENCOUNTER — Telehealth: Payer: Self-pay | Admitting: Pharmacist

## 2021-08-07 NOTE — Telephone Encounter (Signed)
Noted and agree. 

## 2021-08-07 NOTE — Telephone Encounter (Signed)
Phone call from patient.  ? ?Reports Libre 2 sensor displaced from her arm.  ?Requests replacement sample as she has no additional sensors.  ? ?I educated patient to NOT throw away sensors if they fall off but to keep them, request a replacement directly from the manufacturer by contacting them.   ?They would likely replace a sensor intermittently but will require return of sensor that did not work or became dislodged.  ? ?Sample sensor left for patient to pick-up at the [facility] front desk.  ? ? ?

## 2021-09-26 ENCOUNTER — Other Ambulatory Visit (HOSPITAL_COMMUNITY)
Admission: RE | Admit: 2021-09-26 | Discharge: 2021-09-26 | Disposition: A | Discharge status: Home / Self Care | Payer: Medicaid Other | Source: Ambulatory Visit | Attending: Family Medicine | Admitting: Family Medicine

## 2021-09-26 ENCOUNTER — Ambulatory Visit (INDEPENDENT_AMBULATORY_CARE_PROVIDER_SITE_OTHER): Payer: Medicaid Other | Admitting: Family Medicine

## 2021-09-26 ENCOUNTER — Encounter: Payer: Self-pay | Admitting: Family Medicine

## 2021-09-26 VITALS — BP 126/82 | HR 99 | Wt 210.0 lb

## 2021-09-26 DIAGNOSIS — S82851A Displaced trimalleolar fracture of right lower leg, initial encounter for closed fracture: Secondary | ICD-10-CM | POA: Diagnosis not present

## 2021-09-26 DIAGNOSIS — Z23 Encounter for immunization: Secondary | ICD-10-CM | POA: Diagnosis not present

## 2021-09-26 DIAGNOSIS — Z124 Encounter for screening for malignant neoplasm of cervix: Secondary | ICD-10-CM | POA: Diagnosis present

## 2021-09-26 DIAGNOSIS — E1069 Type 1 diabetes mellitus with other specified complication: Secondary | ICD-10-CM

## 2021-09-26 LAB — POCT GLYCOSYLATED HEMOGLOBIN (HGB A1C): HbA1c, POC (controlled diabetic range): 8.3 % — AB (ref 0.0–7.0)

## 2021-09-26 NOTE — Progress Notes (Signed)
    SUBJECTIVE:   CHIEF COMPLAINT / HPI: f/u diabetes  Ankle Fracture- following with [facility], wearing the boot as needed when needed, doing PT. Does not have much pain.  T1DM - BG running 350s fasting in the morning. 20 U Lantus BID and 15 units with every meal. Eating 2 meals per day. Sometimes having lows in the evenings, will alert on Sensor when in 70s, will have a snack and go back up. Unsure how many lows per week. Declines referral to endocrinology as she has previous negative experience. Has not had an eye exam in a year or two.   PERTINENT  PMH / PSH: T1DM  OBJECTIVE:   BP 126/82   Pulse 99   Wt 210 lb (95.3 kg)   LMP 09/13/2021   SpO2 97%   BMI 36.05 kg/m   General: A&O, NAD HEENT: No sign of trauma, EOM grossly intact Cardiac: RRR, no m/r/g Respiratory: CTAB, normal WOB, no w/c/r GI: Soft, NTTP, non-distended  Extremities: NTTP, no peripheral edema. Neuro: Normal gait, boot on right lower extremity, moves all four extremities appropriately. Psych: Appropriate mood and affect  GU: Normal appearance of labia majora and minora, without lesions. Vagina tissue pink, moist, without lesions or abrasions. Cervix normal appearance, non-friable, without discharge from os.   [name] CMA present as chaperone for GU exam.   ASSESSMENT/PLAN:   Encounter for Papanicolaou smear for cervical cancer screening - pap smear done today, STD testing per patient request, declines blood work  Type 1 diabetes mellitus with other specified complication (HCC) - A1c 8.3 today, congratulated patient, recommend 1 month follow up with Dr Raymondo Band, continue home insulin recommendations - recommended eye exam, did urine microalbumin today - offered endocrinology referral, patient declines at this time  Trimalleolar fracture - wearing orthotic boot PRN, following with ortho, no pain today   Tdap given today.   Billey Co, MD [facility]

## 2021-09-26 NOTE — Patient Instructions (Addendum)
It was wonderful to see you today.  Please bring ALL of your medications with you to every visit.   Today we talked about:  - We did your pap smear and checked for sexually transmitted infections and gave you a tetanus shot today - Your A1c was 8.3, keep up the good work! Please schedule a follow up with Dr Raymondo Band in 1 month - We checked your urine today for protein   Thank you for choosing [facility].   Please call [phone] with any questions about today's appointment.  Please be sure to schedule follow up at the front  desk before you leave today.   Please arrive at least 15 minutes prior to your scheduled appointments.   If you had blood work today, I will send you a MyChart message or a letter if results are normal. Otherwise, I will give you a call.   If you had a referral placed, they will call you to set up an appointment. Please give [place] a call if you don't hear back in the next 2 weeks.   If you need additional refills before your next appointment, please call your pharmacy first.   Burley Saver, MD  Family Medicine

## 2021-09-27 DIAGNOSIS — Z124 Encounter for screening for malignant neoplasm of cervix: Secondary | ICD-10-CM | POA: Insufficient documentation

## 2021-09-27 LAB — MICROALBUMIN / CREATININE URINE RATIO
Creatinine, Urine: 137.5 mg/dL
Microalb/Creat Ratio: 7 mg/g creat (ref 0–29)
Microalbumin, Urine: 10.3 ug/mL

## 2021-09-27 NOTE — Assessment & Plan Note (Signed)
-   pap smear done today, STD testing per patient request, declines blood work

## 2021-09-27 NOTE — Assessment & Plan Note (Signed)
-   A1c 8.3 today, congratulated patient, recommend 1 month follow up with Dr Raymondo Band, continue home insulin recommendations - recommended eye exam, did urine microalbumin today - offered endocrinology referral, patient declines at this time

## 2021-09-27 NOTE — Assessment & Plan Note (Signed)
-   wearing orthotic boot PRN, following with ortho, no pain today

## 2021-10-01 LAB — CYTOLOGY - PAP
Adequacy: ABSENT
Chlamydia: NEGATIVE
Comment: NEGATIVE
Comment: NEGATIVE
Comment: NEGATIVE
Comment: NORMAL
Diagnosis: NEGATIVE
High risk HPV: NEGATIVE
Neisseria Gonorrhea: NEGATIVE
Trichomonas: NEGATIVE

## 2021-10-25 ENCOUNTER — Telehealth: Payer: Self-pay

## 2021-10-25 NOTE — Telephone Encounter (Signed)
Patient came into clinic asking about sensor samples. Had front office staff inform patient  that Dr. Raymondo Band is not in the office today and we will reach out when we have an update.   Also recommended contacting manufacturer for possible replacement sensor.   Veronda Prude, RN

## 2021-10-25 NOTE — Telephone Encounter (Signed)
Patient calls nurse line requesting sample of Freestyle [employer]. Patient reports that she fell into the door frame last night and sensor became dislodged.   Will forward to pharmacy team.   Veronda Prude, RN

## 2021-10-29 NOTE — Telephone Encounter (Signed)
Patient called and requested replacement CGM for her Debra Howell.  She is unsure when she may be in the area to pick-up.  I offered another single CGM sensor as replacment for the sensor that was dislodged from her arm with movement.   She will come and pick up in the next two weeks.   Sensor labeled and in pharmacist OFFICE for pick-up.

## 2021-11-15 ENCOUNTER — Ambulatory Visit (INDEPENDENT_AMBULATORY_CARE_PROVIDER_SITE_OTHER): Payer: Medicaid Other | Admitting: Pharmacist

## 2021-11-15 ENCOUNTER — Encounter: Payer: Self-pay | Admitting: Pharmacist

## 2021-11-15 DIAGNOSIS — E1069 Type 1 diabetes mellitus with other specified complication: Secondary | ICD-10-CM | POA: Diagnosis not present

## 2021-11-15 MED ORDER — INSULIN GLARGINE 100 UNIT/ML SOLOSTAR PEN: 20.0000 [IU] | PEN_INJECTOR | Freq: Every morning | SUBCUTANEOUS | 11 refills | Status: DC

## 2021-11-15 NOTE — Assessment & Plan Note (Addendum)
Diabetes longstanding currently with improved control. Patient is able to verbalize appropriate hypoglycemia management plan. Medication adherence appears appropriate. Control is suboptimal due to correction of nocturnal hypoglycemia and early morning reactive hyperglycemia. -Adjusted dose of basal insulin Lantus (insulin glargine) from 12 units BID to 20 units in the morning to help prevent nocturnal dipping. -Continued rapid insulin Novolog (insulin aspart) to 12-18 units with evening meal. Continued 10-15 units with morning/lunchtime meal.

## 2021-11-15 NOTE — Patient Instructions (Signed)
It was nice to see you today!  Your goal blood sugar is 80-130 before eating and less than 180 after eating.  Medication Changes: Skip Lantus tonight (11/15/2021) Change Lantus (insulin glargine) to 20 units once daily in the morning Continue your meal time insulin as prescribed  Keep up the good work with diet and exercise. Aim for a diet full of vegetables, fruit and lean meats (chicken, [place], fish). Try to limit salt intake by eating fresh or frozen vegetables (instead of canned), rinse canned vegetables prior to cooking and do not add any additional salt to meals.

## 2021-11-15 NOTE — Progress Notes (Signed)
S:    Chief Complaint  Patient presents with   Diabetes   Debra Howell is a 36 y.o. female who presents for diabetes evaluation, education, and management. PMH is significant for T1DM. Patient was referred and last seen by Primary Care Provider, Dr. Miquel Dunn, on 09/26/2021. At last visit, no changes were made to her insulin regimen.   Today, patient arrives in good spirits and presents with the assistance of a cane and her mother. She reports that her pharmacy did not let her fill her insulin so she has been trying to make her supply last. She's been injection ~10 units BID but her mealtime insulin is unchanged. She was able to get a refill on Lantus today. Reports issues getting FreeStyle Libre to stick to arm.   Current diabetes medications include: Lantus (insulin glargine) 12 units BID (taking ~10 units BID due to trying to make supply last), Novolog (insulin aspart) 12-15 units BID prior to meals and routinely she is using AM correction dose for AM fasting blood glucose elevation.   Patient reports adherence to taking all medications as prescribed, when she has a full supply of insulin.   Do you feel that your medications are working for you? yes Have you been experiencing any side effects to the medications prescribed? no Do you have any problems obtaining medications due to transportation or finances? no Insurance coverage: Medicaid  Patient reports hypoglycemic events. 3x/week. Corrects with an oatmeal cream pie.    Patient denies nocturia (nighttime urination).  Patient denies neuropathy (nerve pain). Patient denies visual changes. Patient reports self foot exams.   Patient reported dietary habits: Eats 2 meals/day Breakfast: skip breakfast Lunch: pizza, 2 slices Dinner: Donzetta Sprung  Drinks: water, diet Dr. Reino Kent   Patient-reported exercise habits: moving more since fracture has began to heal, 100 ft to mailbox   O:  Review of Systems  All other systems reviewed and are  negative.   Physical Exam Constitutional:      Appearance: Normal appearance. She is obese.  Pulmonary:     Effort: Pulmonary effort is normal.  Neurological:     Mental Status: She is alert.  Psychiatric:        Mood and Affect: Mood normal.        Behavior: Behavior normal.    11/02/21-11/15/21 CGM Download:  % Time CGM is active: 92% Average Glucose: 182 mg/dL Glucose Management Indicator: 7.7  Glucose Variability: 39.3 (goal <36%) Time in Goal:  - Time in range 70-180: 50% - Time above range: 48% - Time below range: 2%  Lab Results  Component Value Date   HGBA1C 8.3 (A) 09/26/2021   A/P: Diabetes longstanding currently with improved control. Patient is able to verbalize appropriate hypoglycemia management plan. Medication adherence appears appropriate. Control is suboptimal due to correction of nocturnal hypoglycemia and early morning reactive hyperglycemia. -Adjusted dose of basal insulin Lantus (insulin glargine) from 12 units BID to 20 units in the morning to help prevent nocturnal dipping. -Continued rapid insulin Novolog (insulin aspart) to 12-18 units with evening meal. Continued 10-15 units with morning/lunchtime meal.  -Provided patient FreeStyle [facility] number to return and get replacement for malfunctioning sensors. -Extensively discussed pathophysiology of diabetes, recommended lifestyle interventions, dietary effects on blood sugar control.  -Counseled on s/sx of and management of hypoglycemia. .  -Next A1c anticipated 12/2021.   Written patient instructions provided. Patient verbalized understanding of treatment plan.  Total time in face to face counseling 32 minutes.  Follow-up:  Pharmacist in 6-8 weeks. PCP clinic visit on 11/28/21.  Patient seen with Cherie Ouch, PharmD Candidate, Rennis Petty, PharmD PGY-1 Resident, and Valeda Malm, PharmD, PGY2 Pharmacy Resident.  Marland Kitchen

## 2021-11-15 NOTE — Progress Notes (Signed)
Reviewed: I agree with Dr. Koval's documentation and management. 

## 2021-11-23 ENCOUNTER — Ambulatory Visit: Payer: Medicaid Other | Admitting: Family Medicine

## 2021-11-28 ENCOUNTER — Ambulatory Visit (INDEPENDENT_AMBULATORY_CARE_PROVIDER_SITE_OTHER): Payer: Medicaid Other | Admitting: Family Medicine

## 2021-11-28 ENCOUNTER — Other Ambulatory Visit: Payer: Self-pay | Admitting: Family Medicine

## 2021-11-28 VITALS — BP 104/68 | HR 104 | Wt 230.8 lb

## 2021-11-28 DIAGNOSIS — Z3046 Encounter for surveillance of implantable subdermal contraceptive: Secondary | ICD-10-CM | POA: Diagnosis not present

## 2021-11-28 DIAGNOSIS — E039 Hypothyroidism, unspecified: Secondary | ICD-10-CM | POA: Diagnosis present

## 2021-11-28 MED ORDER — ETONOGESTREL 68 MG ~~LOC~~ IMPL
68.0000 mg | DRUG_IMPLANT | Freq: Once | SUBCUTANEOUS | Status: AC
  Administered 2021-11-28: 68 mg via SUBCUTANEOUS

## 2021-11-28 MED ORDER — ETONOGESTREL 68 MG ~~LOC~~ IMPL: 1.0000 | DRUG_IMPLANT | Freq: Once | SUBCUTANEOUS | 0 refills | Status: AC

## 2021-11-28 NOTE — Assessment & Plan Note (Signed)
See procedure note above, tolerated well.

## 2021-11-28 NOTE — Patient Instructions (Addendum)
It was wonderful to see you today.  Please bring ALL of your medications with you to every visit.   Today we talked about:  We removed and replaced your nexplanon today in your left arm.   Lot #: [id number]  It is good for three years! Please use back up contraception for one week!  If your arm has any redness, bleeding, drainage from the insertion site, or severe pain, or you have fevers, give [place] a call!   Thank you for choosing [facility].   Please call [phone] with any questions about today's appointment.  Please be sure to schedule follow up at the front  desk before you leave today.   Please arrive at least 15 minutes prior to your scheduled appointments.   If you had blood work today, I will send you a MyChart message or a letter if results are normal. Otherwise, I will give you a call.   If you had a referral placed, they will call you to set up an appointment. Please give [place] a call if you don't hear back in the next 2 weeks.   If you need additional refills before your next appointment, please call your pharmacy first.   Burley Saver, MD  Family Medicine

## 2021-11-28 NOTE — Addendum Note (Signed)
Addended by: Veronda Prude on: 11/28/2021 12:25 PM   Modules accepted: Orders

## 2021-11-28 NOTE — Progress Notes (Signed)
    SUBJECTIVE:   CHIEF COMPLAINT / HPI:   Nexplanon- hoping to have it removed today and new one placed. This was placed in 2014. On her menses now, she is sexually active, LMP 11/24/21.   PERTINENT  PMH / PSH: T1DM, hypothyroidism, asthma  OBJECTIVE:   BP 104/68   Pulse (!) 104   Wt 230 lb 12.8 oz (104.7 kg)   SpO2 99%   BMI 38.92 kg/m   General: alert & oriented, no apparent distress, well groomed HEENT: normocephalic, atraumatic, EOM grossly intact, oral mucosa moist, neck supple Respiratory: normal respiratory effort GI: non-distended Skin: no rashes, no jaundice Psych: appropriate mood and affect MSK: in left arm, palpable Nexplanon rod, small scar appreciated  Nexplanon Removal Note: The patient's [x]  left/[ ]  right arm was palpated and the implant device located. The area was prepped with [x]  Betadine /[ ]  Hibiclens. The distal end of the device was palpated and 3 cc of 1% lidocaine with epinephrine was injected. A 3 mm incision was made. Any fibrotic tissue was carefully dissected away using blunt and/or sharp dissection. The device was removed in an intact manner. As she has had a normal menstrual period and currently on it, a pregnancy test was not obtained. . Benefits, indications/contraindications and potential risks of insertion including local infection, scar/keloid formation, and unscheduled bleeding were discussed with the patient. Nexplanon  trocar was inserted subcutaneously at site of incision and then Nexplanon  capsule delivered subcutaneously. Trocar was removed from the insertion site. Nexplanon  capsule was palpated by provider and patient to assure satisfactory placement. Estimated blood loss of <1 mL Dressings applied: _ Adhesive Dressing x Gauze/Tape _ Biocclusive The patient tolerated the procedure well without complications. Backup contraception recommended for one week  LOT #:  ASSESSMENT/PLAN:   Encounter for removal and reinsertion of  Nexplanon See procedure note above, tolerated well.     , MD [facility]

## 2022-01-30 ENCOUNTER — Telehealth: Payer: Self-pay

## 2022-01-30 ENCOUNTER — Other Ambulatory Visit (HOSPITAL_COMMUNITY): Payer: Self-pay

## 2022-01-30 NOTE — Telephone Encounter (Signed)
A Prior Authorization was initiated for this patients FreeStyle Libre 2 Sensor through [employer].   Key: [id number]

## 2022-02-01 NOTE — Telephone Encounter (Signed)
Prior Auth for patients medication FREESTYLE LIBRE 2 SENSOR approved by [facility] MEDICAID from 01/16/22 to 01/30/23.   Key: [id number]

## 2022-05-17 ENCOUNTER — Other Ambulatory Visit (HOSPITAL_COMMUNITY): Payer: Self-pay

## 2022-05-23 ENCOUNTER — Encounter: Payer: Self-pay | Admitting: Pharmacist

## 2022-05-23 ENCOUNTER — Ambulatory Visit (INDEPENDENT_AMBULATORY_CARE_PROVIDER_SITE_OTHER): Payer: Medicaid Other | Admitting: Pharmacist

## 2022-05-23 VITALS — BP 125/80 | HR 79 | Wt 226.2 lb

## 2022-05-23 DIAGNOSIS — E1069 Type 1 diabetes mellitus with other specified complication: Secondary | ICD-10-CM | POA: Diagnosis not present

## 2022-05-23 MED ORDER — TRESIBA FLEXTOUCH 100 UNIT/ML ~~LOC~~ SOPN: 16.0000 [IU] | PEN_INJECTOR | Freq: Every day | SUBCUTANEOUS | 11 refills | Status: DC

## 2022-05-23 MED ORDER — NOVOLOG FLEXPEN 100 UNIT/ML ~~LOC~~ SOPN: 15.0000 [IU] | PEN_INJECTOR | Freq: Two times a day (BID) | SUBCUTANEOUS | 11 refills | Status: DC

## 2022-05-23 NOTE — Patient Instructions (Addendum)
It was nice to see you today!  Your goal blood sugar is 80-130 before eating and less than 180 after eating.  Medication Changes: Start taking Lantus 8 units twice daily. We are trying to get [place] approved through your insurance. Continue taking Lantus until you run out.   Continue Humalog 15-20 units twice daily.   Monitor blood sugars at home and keep a log (glucometer or piece of paper) to bring with you to your next visit.  Keep up the good work with diet and exercise. Aim for a diet full of vegetables, fruit and lean meats (chicken, [place], fish). Try to limit salt intake by eating fresh or frozen vegetables (instead of canned), rinse canned vegetables prior to cooking and do not add any additional salt to meals.

## 2022-05-23 NOTE — Progress Notes (Signed)
Reviewed: I agree with Dr. Koval's documentation and management. 

## 2022-05-23 NOTE — Assessment & Plan Note (Signed)
Diabetes longstanding, currently close to goal based on CGM GMI. However, patient is having recurrent nocturnal hypoglycemia. Patient is able to verbalize appropriate hypoglycemia management plan. Medication adherence appears appropriate. -Adjusted dose of basal insulin Lantus (insulin glargine) from 20 units daily to 8 units BID as it appears Lantus is not lasting a full 24 hours. Will attempt to get approval of Tresiba through [facility] [place] is non-preferred). Instructed patient to finish out supply of Lantus until a Tresiba coverage is determined.  -Continued rapid insulin Novolog (insulin aspart) 15-20 units BID.  -Patient educated on purpose, proper use, and potential adverse effects of insulin.  -Extensively discussed pathophysiology of diabetes, recommended lifestyle interventions, dietary effects on blood sugar control.  -Counseled on s/sx of and management of hypoglycemia.

## 2022-05-23 NOTE — Progress Notes (Signed)
S:    Chief Complaint  Patient presents with   Medication Management    Diabetes/CGM   37 y.o. female who presents for diabetes evaluation, education, and management.  PMH is significant for T1DM.  Patient was referred by PCP, Dr. Thompson Grayer on 09/26/2021. Patient was last seen by PCP on 11/28/2021. Last seen by pharmacy clinic on 11/15/2021 where  At last visit, Lantus (insulin glargine) was changed from 12 units BID to 20 units in the morning.   Today, patient arrives in good spirits and presents with her mom. Reports getting hypoglycemia about twice a day. States she has been having to use a home supply of Novolin R for ~2 weeks due to having issues getting it refilled at the pharmacy.   Current diabetes medications include: Lantus 20 units daily, Novolin R 15-20 units daily (had to use old supply of Novolin R due to issues getting refills of Novolog)  Patient reports adherence to taking all medications as prescribed.   Insurance coverage: Medicaid  Patient reports hypoglycemic events. Hypoglycemia observed on CGM.   Patient denies nocturia (nighttime urination).  Patient denies neuropathy (nerve pain). Patient denies visual changes. Patient denies self foot exams.   Patient reported dietary habits: usually skips breakfast  Patient-reported exercise habits: moving more with boot removal.    O:  Review of Systems  All other systems reviewed and are negative.   Physical Exam Constitutional:      Appearance: Normal appearance. She is obese.  Pulmonary:     Effort: Pulmonary effort is normal.  Neurological:     Mental Status: She is alert.  Psychiatric:        Mood and Affect: Mood normal.        Behavior: Behavior normal.        Thought Content: Thought content normal.    CGM Download:  % Time CGM is active: 95% Average Glucose: 162 mg/dL Glucose Management Indicator: 7.2  Glucose Variability: 42% (goal <36%) Time in Goal:  - Time in range 70-180: 52% - Time above  range: 41% - Time below range: 7% -Observed patterns: nocturnal dipping (with one occurrence lasting ~3 hours), appears Lantus to not be lasting the full 24 hours    Lab Results  Component Value Date   HGBA1C 8.3 (A) 09/26/2021   Vitals:   05/23/22 1129  BP: 125/80  Pulse: 79  SpO2: 100%     Clinical Atherosclerotic Cardiovascular Disease (ASCVD): No    A/P: Diabetes longstanding, currently close to goal based on CGM GMI. However, patient is having recurrent nocturnal hypoglycemia. Patient is able to verbalize appropriate hypoglycemia management plan. Medication adherence appears appropriate. -Adjusted dose of basal insulin Lantus (insulin glargine) from 20 units daily to 8 units BID as it appears Lantus is not lasting a full 24 hours. Will attempt to get approval of Tresiba through [facility] [place] is non-preferred). Instructed patient to finish out supply of Lantus until a Tresiba coverage is determined.  -Continued rapid insulin Novolog (insulin aspart) 15-20 units BID.  -Patient educated on purpose, proper use, and potential adverse effects of insulin.  -Extensively discussed pathophysiology of diabetes, recommended lifestyle interventions, dietary effects on blood sugar control.  -Counseled on s/sx of and management of hypoglycemia.   Written patient instructions provided. Patient verbalized understanding of treatment plan.  Total time in face to face counseling 40 minutes.    Follow-up:  Pharmacist 4 weeks. Patient seen with Dixon Boos,  PharmD Candidate, Francena Hanly, PharmD, PGY1 Pharmacy Resident,  and Joseph Art, PharmD, PGY2 Pharmacy Resident.

## 2022-05-24 ENCOUNTER — Other Ambulatory Visit (HOSPITAL_COMMUNITY): Payer: Self-pay

## 2022-05-24 ENCOUNTER — Telehealth: Payer: Self-pay

## 2022-05-24 NOTE — Telephone Encounter (Signed)
Prior Auth for patients medication TRESIBA approved by [facility] MEDICAID until 05/24/23.

## 2022-05-24 NOTE — Telephone Encounter (Signed)
A Prior Authorization was initiated for this patients TRESIBA through [employer].   Key: [id number]

## 2022-06-14 ENCOUNTER — Other Ambulatory Visit (HOSPITAL_COMMUNITY): Payer: Self-pay

## 2022-06-20 ENCOUNTER — Ambulatory Visit (INDEPENDENT_AMBULATORY_CARE_PROVIDER_SITE_OTHER): Payer: Medicaid Other | Admitting: Pharmacist

## 2022-06-20 ENCOUNTER — Encounter: Payer: Self-pay | Admitting: Pharmacist

## 2022-06-20 VITALS — BP 122/73 | HR 94 | Wt 223.2 lb

## 2022-06-20 DIAGNOSIS — E1069 Type 1 diabetes mellitus with other specified complication: Secondary | ICD-10-CM

## 2022-06-20 MED ORDER — NOVOLOG FLEXPEN 100 UNIT/ML ~~LOC~~ SOPN: 8.0000 [IU] | PEN_INJECTOR | Freq: Two times a day (BID) | SUBCUTANEOUS | 11 refills | Status: DC

## 2022-06-20 MED ORDER — INSULIN GLARGINE 100 UNIT/ML SOLOSTAR PEN: 10.0000 [IU] | PEN_INJECTOR | Freq: Two times a day (BID) | SUBCUTANEOUS | 11 refills | Status: DC

## 2022-06-20 NOTE — Progress Notes (Signed)
S:     Chief Complaint  Patient presents with   Medication Management    Diabetes management   37 y.o. female who presents for diabetes evaluation, education, and management.  PMH is significant for asthma.  Patient was referred and last seen by Primary Care Provider, Dr. Thompson Grayer, on 11/28/2021.   At last visit, long-acting insulin was switched from Lantus (insulin glargine) to Tresiba (insulin degludec) in attempt to provide a more consistent basal insulin effect.    Today, patient arrives in good spirits and presents without any assistance. Reports feeling like her blood sugar readings at home have "been all over the place". Patient states she believes she had better blood sugar control with Lantus (insulin glargine).   Current diabetes medications include: Tresiba (insulin degludec) 12 units twice daily, Novolog (insulin aspart) 10-14 units twice daily  Patient reports adherence to taking all medications as prescribed.   Insurance coverage: Medicaid  Patient reports hypoglycemic events. Hypoglycemic events observed on CGM report. States she eats one oatmeal cookie to help correct low blood sugars.   Patient reports nocturia (nighttime urination).   Patient reported dietary habits: reports eating chicken salad ~1 time per week, broccoli with cheese, beanie weenies ~1 time per week, green bean casserole, celery with peanut butter, carrots with ranch Snacks: oatmeal cookies - 1 cookie when blood sugar is low  O:   Review of Systems  All other systems reviewed and are negative.   Physical Exam Constitutional:      Appearance: Normal appearance.  Pulmonary:     Effort: Pulmonary effort is normal.  Neurological:     Mental Status: She is alert.  Psychiatric:        Mood and Affect: Mood normal.        Behavior: Behavior normal.        Thought Content: Thought content normal.    Freestyle Libre 2 CGM Download:  % Time CGM is active: 96% Average Glucose: 180 mg/dL Glucose  Management Indicator: 7.6%  Glucose Variability: 43.9% (goal <36%) Time in Goal:  - Time in range 70-180: 49% - Time above range: 46% - Time below range: 5% Observed patterns: high glucose variability despite GMI being close to goal  Lab Results  Component Value Date   HGBA1C 8.3 (A) 09/26/2021   Vitals:   06/20/22 1110  BP: 122/73  Pulse: 94  SpO2: 100%   Clinical Atherosclerotic Cardiovascular Disease (ASCVD): No   Patient is participating in a Managed Medicaid Plan:  Yes   A/P: Diabetes longstanding, currently close to goal based on CGM GMI. However, most recent CGM report demonstrated high glucose variability with several hypoglycemic events in both the evenings and afternoons. Patient is able to verbalize appropriate hypoglycemia management plan. Medication adherence appears appropriate. Suspect control is suboptimal due to dietary habits and overcorrection with mealtime insulin coverage as well as glucose intake during hypoglycemic events. -Discontinue Tresiba (insulin degludec). -Restarted basal insulin Lantus (insulin glargine) 10 units twice daily -Decreased dose of rapid insulin Novolog (insulin aspart) from 15-20 units twice daily to 8,9,10 units twice daily.  -Patient educated on purpose, proper use, and potential adverse effects.  -Extensively discussed pathophysiology of diabetes, recommended lifestyle interventions, dietary effects on blood sugar control.  -Counseled on s/sx of and management of hypoglycemia. Encouraged patient to try drinking juice instead of oatmeal cookies to help correct low blood sugars and to reduce the amount of glucose when trying to correct to avoid overcorrection. -Counseled to eat more fiber and  protein in diet to avoid carb loading, things like nuts, raw vegetables, or sliced [place].  Written patient instructions provided. Patient verbalized understanding of treatment plan.  Total time in face to face counseling 50 minutes.    Follow-up:   Pharmacist in 5 weeks. PCP clinic visit in in 5 weeks.  Patient seen with Louanne Belton PharmD PGY-1 Pharmacy Resident and Joseph Art, PharmD, PGY2 Pharmacy Resident.

## 2022-06-20 NOTE — Patient Instructions (Addendum)
It was nice to see you today!  Your goal blood sugar is 80-130 before eating and less than 180 after eating.  Medication Changes: STOP Tresiba (insulin degludec) and START Lantus (insulin glargine) 10 units twice daily  Decrease mealtime insulin, Novolog (insulin aspart), to 8-10 units twice daily before meals.  Try to increase the amount of fiber and protein in your diet. This includes foods like nuts, raw vegetables, peanut butter, and sliced [place].  Monitor blood sugars at home and keep a log (glucometer or piece of paper) to bring with you to your next visit.  Keep up the good work with diet and exercise. Aim for a diet full of vegetables, fruit and lean meats (chicken, [place], fish). Try to limit salt intake by eating fresh or frozen vegetables (instead of canned), rinse canned vegetables prior to cooking and do not add any additional salt to meals.

## 2022-06-20 NOTE — Assessment & Plan Note (Signed)
Diabetes longstanding, currently close to goal based on CGM GMI. However, most recent CGM report demonstrated high glucose variability with several hypoglycemic events in both the evenings and afternoons. Patient is able to verbalize appropriate hypoglycemia management plan. Medication adherence appears appropriate. Suspect control is suboptimal due to dietary habits and overcorrection with mealtime insulin coverage as well as glucose intake during hypoglycemic events. -Discontinue Tresiba (insulin degludec). -Restarted basal insulin Lantus (insulin glargine) 10 units twice daily -Decreased dose of rapid insulin Novolog (insulin aspart) from 15-20 units twice daily to 8,9,10 units twice daily.  -Patient educated on purpose, proper use, and potential adverse effects.  -Extensively discussed pathophysiology of diabetes, recommended lifestyle interventions, dietary effects on blood sugar control.  -Counseled on s/sx of and management of hypoglycemia. Encouraged patient to try drinking juice instead of oatmeal cookies to help correct low blood sugars and to reduce the amount of glucose when trying to correct to avoid overcorrection. -Counseled to eat more fiber and protein in diet to avoid carb loading, things like nuts, raw vegetables, or sliced [place].

## 2022-06-21 NOTE — Progress Notes (Signed)
Reviewed and agree with Dr Koval's plan.   

## 2022-07-19 ENCOUNTER — Other Ambulatory Visit: Payer: Self-pay

## 2022-07-19 MED ORDER — FREESTYLE LIBRE 2 SENSOR MISC: 11 refills | Status: DC

## 2022-07-25 ENCOUNTER — Ambulatory Visit: Payer: Medicaid Other | Admitting: Pharmacist

## 2022-07-29 ENCOUNTER — Other Ambulatory Visit (HOSPITAL_COMMUNITY): Payer: Self-pay

## 2022-07-29 ENCOUNTER — Telehealth: Payer: Self-pay

## 2022-07-29 NOTE — Telephone Encounter (Signed)
A Prior Authorization was initiated for this patients FREESTYLE LIBRE 2 SENSOR through CoverMyMeds.   Key: [id number]  Not sure why it needs a renewal already, previously approved until 01/2023.

## 2022-07-30 NOTE — Progress Notes (Unsigned)
    SUBJECTIVE:   CHIEF COMPLAINT / HPI:   T1DM- just met with Dr Raymondo Band, made adjustments to her short acting insulin. Her A1c per her Josephine Igo monitor was 7.4. OK with getting blood test and cholesterol checked today. No neuropathy or pain in feet.  Trimalleolar fracture- all healed, has a placed that the bone sticks out and is wondering if this is okay. NO pain with walking. NO redness, swelling, fevers or chills.  Amenorrhea x 3 months- had Nexplanon replaced last August 2023, still was having periods, wanting pregnancy test nervous about missed menses. Notes she feels ok with continuing Nexplanon for now even if it makes her menses abnormal.  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 She notes she is ok checking TSH today.  PERTINENT  PMH / PSH: T1DM, h/o trimalleolar fracture, h/o hypothyroidism per chart review  OBJECTIVE:   BP 125/89   Pulse (!) 106   Ht  (1.626 m)   Wt 223 lb 6.4 oz (101.3 kg)   SpO2 99%   BMI 38.35 kg/m   General: A&O, NAD HEENT: No sign of trauma, EOM grossly intact Cardiac: RRR, no m/r/g Respiratory: CTAB, normal WOB, no w/c/r GI: Soft, NTTP, non-distended  Extremities: NTTP, no peripheral edema. Small bony prominence on R lateral ankle, surgical scar well healed, no erythema, swelling, or drainage. Neuro: Normal gait, moves all four extremities appropriately. Psych: Appropriate mood and affect  Dermatologic Exam: Nails: No onchomycosis. No nail bed thickening. Callouses: No callouses. No fissures. Web spaces: No macerations or open lesions Redness/Erythema: None  Musculoskeletal Exam: No bunion, hammertoes, prominent metatarsals, collapsed arch, or previous amputation. Vascular Assessment: Pedal hair growth present. 2+ posterior tibial and dorsalis pedis  pulses. Neurologic Exam: 10-gram monofilament exam, R 6/6 and L 6/6.   ASSESSMENT/PLAN:   Type 1 diabetes mellitus with other specified complication (HCC) A1c per Josephine Igo is 7.4, will check blood as well but congratulated patient Foot exam WNL, recommended eye exam Checking lipids Check urine microalbumin/Cr ratio Continue insulin titration with Dr Raymondo Band- Lantus 12 U BID, and Adjust Novolog to: pre-meal blood glucose < 150 - inject 8 units -150-200  10 units > 200 inject 12 units  Trimalleolar fracture Healed, no signs of infection, walking without pain  Hypothyroidism Checking TSH today, pt not on medications  Missed period Upreg negative today, counseled on irregular menses with Nexplanon, she would like to continue Nexplanon at this time     Billey Co, MD [facility]

## 2022-07-30 NOTE — Telephone Encounter (Signed)
Prior Auth for patients medication FREESTYLE LIBRE 2 SENSOR approved by [facility] MEDICAID from 07/29/22 to 07/29/23.  CoverMyMeds Key: [id number] PA Case ID #: [id number]

## 2022-08-01 ENCOUNTER — Ambulatory Visit (INDEPENDENT_AMBULATORY_CARE_PROVIDER_SITE_OTHER): Payer: Medicaid Other | Admitting: Family Medicine

## 2022-08-01 ENCOUNTER — Ambulatory Visit (INDEPENDENT_AMBULATORY_CARE_PROVIDER_SITE_OTHER): Payer: Medicaid Other | Admitting: Pharmacist

## 2022-08-01 ENCOUNTER — Encounter: Payer: Self-pay | Admitting: Family Medicine

## 2022-08-01 VITALS — BP 125/89 | HR 106 | Ht 64.0 in | Wt 223.4 lb

## 2022-08-01 DIAGNOSIS — E038 Other specified hypothyroidism: Secondary | ICD-10-CM

## 2022-08-01 DIAGNOSIS — S82853S Displaced trimalleolar fracture of unspecified lower leg, sequela: Secondary | ICD-10-CM

## 2022-08-01 DIAGNOSIS — E1069 Type 1 diabetes mellitus with other specified complication: Secondary | ICD-10-CM | POA: Diagnosis not present

## 2022-08-01 DIAGNOSIS — N926 Irregular menstruation, unspecified: Secondary | ICD-10-CM | POA: Diagnosis not present

## 2022-08-01 DIAGNOSIS — E039 Hypothyroidism, unspecified: Secondary | ICD-10-CM | POA: Diagnosis not present

## 2022-08-01 LAB — POCT URINE PREGNANCY: Preg Test, Ur: NEGATIVE

## 2022-08-01 MED ORDER — INSULIN GLARGINE 100 UNIT/ML SOLOSTAR PEN
12.0000 [IU] | PEN_INJECTOR | Freq: Two times a day (BID) | SUBCUTANEOUS | 11 refills | Status: DC
Start: 2022-08-01 — End: 2022-12-12

## 2022-08-01 MED ORDER — NOVOLOG FLEXPEN 100 UNIT/ML ~~LOC~~ SOPN
PEN_INJECTOR | SUBCUTANEOUS | 11 refills | Status: DC
Start: 2022-08-01 — End: 2022-11-04

## 2022-08-01 MED ORDER — FREESTYLE LIBRE 2 SENSOR MISC
11 refills | Status: DC
Start: 2022-08-01 — End: 2023-02-13

## 2022-08-01 NOTE — Assessment & Plan Note (Signed)
Checking TSH today, pt not on medications

## 2022-08-01 NOTE — Assessment & Plan Note (Signed)
Upreg negative today, counseled on irregular menses with Nexplanon, she would like to continue Nexplanon at this time

## 2022-08-01 NOTE — Assessment & Plan Note (Signed)
Diabetes longstanding currently closer to goal, but continues to have high glucose variability at 41.4% with frequent lows. Patient is able to verbalize appropriate hypoglycemia management plan. Medication adherence appears appropriate. Control likely suboptimal due to overcorrection with mealtime coverage and increased glucose intake during hypoglycemic events. -Adjusted dose of basal insulin Lantus (insulin glargine) from 10 units to 12 units twice daily.  -Decreased dose of rapid insulin Novolog (insulin aspart) to the following scale at mealtimes:  Glucose < 150 = 8 units Glucose 150-200 = 10 units Glucose > 200 = 12 units  -Continued Libre 2 to monitor blood glucoses. Counseled to contact clinic if she runs out of refills for her Josephine Igo 2 in the future.  -Patient educated on purpose, proper use, and potential adverse effects.  -Extensively discussed pathophysiology of diabetes, recommended lifestyle interventions, dietary effects on blood sugar control.  -Counseled on s/sx of and management of hypoglycemia.

## 2022-08-01 NOTE — Assessment & Plan Note (Signed)
Healed, no signs of infection, walking without pain

## 2022-08-01 NOTE — Addendum Note (Signed)
Addended by: Burley Saver E on: 08/01/2022 11:45 AM   Modules accepted: Level of Service

## 2022-08-01 NOTE — Patient Instructions (Addendum)
It was nice to see you today!  Your goal blood sugar is 80-130 before eating and less than 180 after eating.  Medication Changes: Continue Lantus 12 units BID  Adjust Novolog to: pre-meal blood glucose < 150 - inject 8 units -150-200  10 units > 200 inject 12 units  Monitor blood sugars at home and keep a log (glucometer or piece of paper) to bring with you to your next visit.  Keep up the good work with diet and exercise. Aim for a diet full of vegetables, fruit and lean meats (chicken, [place], fish). Try to limit salt intake by eating fresh or frozen vegetables (instead of canned), rinse canned vegetables prior to cooking and do not add any additional salt to meals.

## 2022-08-01 NOTE — Progress Notes (Signed)
Reviewed and agree with Dr Koval's plan.   

## 2022-08-01 NOTE — Patient Instructions (Addendum)
It was wonderful to see you today.  Please bring ALL of your medications with you to every visit.   Today we talked about:  I recommend you get an eye exam for your diabetes. Your foot exam today was normal.  We will check your cholesterol and A1c on your blood today. We will also check your urine for protein.  You are NOT PREGNANT. The nexplanon is in place and is 99.9% effective so even if not having your periods that is normal.   Thank you for choosing [facility].   Please call [phone] with any questions about today's appointment.  Please arrive at least 15 minutes prior to your scheduled appointments.   If you had blood work today, I will send you a MyChart message or a letter if results are normal. Otherwise, I will give you a call.   If you had a referral placed, they will call you to set up an appointment. Please give [place] a call if you don't hear back in the next 2 weeks.   If you need additional refills before your next appointment, please call your pharmacy first.   Burley Saver, MD  Family Medicine

## 2022-08-01 NOTE — Progress Notes (Signed)
S:    Chief Complaint  Patient presents with   Medication Management    T1DM   37 y.o. female who presents for diabetes evaluation, education, and management.  PMH is significant for asthma and type 1 diabetes.  Patient was referred and last seen by Primary Care Provider, Dr. Miquel Dunn, on 11/28/21. Last Rx visit 06/20/22.   At last visit, patient was restarted Lantus (insulin glargine) 10 units BID and decreased her dose of Novolog (insulin aspart) from 15-20 units to 8-10 units. Today, she reports taking Lantus (insulin glargine) 12 units BID and Novolog 5-15 units at mealtimes.   Today, patient arrives in good spirits and presents without any assistance. She ran out of refills for her Josephine Igo 2, so for the past 2 weeks she has been using her old meter to read her glucose at home. Reports lows once a day most days which has not changed from last visit. When her boyfriend makes her large meals, she reports taking the full 15 units of the sliding scale dose. She verbalizes that her insulin may be what is causing her to have lows. When she is low, she gets jittery and has headaches. When she is high she feels sick and unable to eat with a headache as well. She endorses her fear of having high readings is much greater than her fear of lows due to presentation of the symptoms. She expresses that managing her diabetes in exhausting and difficult to do with her having frequent highs and lows.   Current diabetes medications include: Lantus (insulin glargine) 10 units BID, Novolog 8-10 units with meals.   Patient reports adherence to taking all medications as prescribed.  Patient denies adherence with medications, reports missing no doses of her medications throughout the week.   Do you feel that your medications are working for you? yes Have you been experiencing any side effects to the medications prescribed? yes Do you have any problems obtaining medications due to transportation or finances?  no Insurance coverage: medicaid   Patient reports hypoglycemic events.  Reported home fasting blood sugars: 50-200s  Reported 2 hour post-meal/random blood sugars: 200-300s. Meter shows she has lows during the middle of the day in addition to in the middle of the night.   Patient reports nocturia (nighttime urination). Reports having to go more when she is high Patient denies neuropathy (nerve pain). Patient denies visual changes. Patient denies self foot exams.   Patient reported dietary habits: Eats 2 meals/day Breakfast: cereal or oatmeal  Has been trying to eat more peanut butter but it has been difficult to not get tired from eating in consistently.   Patient-reported exercise habits: Gets lots of physical activity taking care of her daughter and working outside in her garden.    O:   Review of Systems  Constitutional: Negative.   Genitourinary:  Positive for frequency.  All other systems reviewed and are negative.   Physical Exam Vitals reviewed.  Constitutional:      Appearance: Normal appearance. She is obese.  Cardiovascular:     Rate and Rhythm: Normal rate.  Neurological:     Mental Status: She is alert and oriented to person, place, and time. Mental status is at baseline.  Psychiatric:        Mood and Affect: Mood normal.        Behavior: Behavior normal.        Thought Content: Thought content normal.        Judgment: Judgment normal.  07/05/22-07/18/22 CGM Download:  % Time CGM is active: 98% Average Glucose: 171 mg/dL Glucose Management Indicator: 7.4%  Glucose Variability: 41.4% (goal <36%) Time in Goal:  - Time in range 70-180: 51% - Time above range: 42% - Time below range: 7% Observed patterns:   Lab Results  Component Value Date   HGBA1C 8.3 (A) 09/26/2021   Vitals:   08/01/22 1029  BP: 125/89  Pulse: (!) 106  SpO2: 99%    Clinical Atherosclerotic Cardiovascular Disease (ASCVD): No  The ASCVD Risk score (Arnett DK, et al., 2019)  failed to calculate for the following reasons:   The 2019 ASCVD risk score is only valid for ages 68 to 87   A/P: Diabetes longstanding currently closer to goal, but continues to have high glucose variability at 41.4% with frequent lows. Patient is able to verbalize appropriate hypoglycemia management plan. Medication adherence appears appropriate. Control likely suboptimal due to overcorrection with mealtime coverage and increased glucose intake during hypoglycemic events. -Adjusted dose of basal insulin Lantus (insulin glargine) from 10 units to 12 units twice daily.  -Decreased dose of rapid insulin Novolog (insulin aspart) to the following scale at mealtimes:  Glucose < 150 = 8 units Glucose 150-200 = 10 units Glucose > 200 = 12 units  -Continued Libre 2 to monitor blood glucoses. Counseled to contact clinic if she runs out of refills for her Josephine Igo 2 in the future.  -Patient educated on purpose, proper use, and potential adverse effects.  -Extensively discussed pathophysiology of diabetes, recommended lifestyle interventions, dietary effects on blood sugar control.  -Counseled on s/sx of and management of hypoglycemia.   Written patient instructions provided. Patient verbalized understanding of treatment plan.  Total time in face to face counseling 35 minutes.    Follow-up:  Pharmacist 10/03/22 Patient seen with Earl Gala PGY-1 Pharmacy Resident, Revonda Standard, PharmD Candidate and Valeda Malm, PharmD, PGY2 Pharmacy Resident.

## 2022-08-01 NOTE — Assessment & Plan Note (Signed)
A1c per Josephine Igo is 7.4, will check blood as well but congratulated patient Foot exam WNL, recommended eye exam Checking lipids Check urine microalbumin/Cr ratio Continue insulin titration with Dr Raymondo Band- Lantus 12 U BID, and Adjust Novolog to: pre-meal blood glucose < 150 - inject 8 units -150-200  10 units > 200 inject 12 units

## 2022-08-02 LAB — T4F: T4,Free (Direct): 0.52 ng/dL — ABNORMAL LOW (ref 0.82–1.77)

## 2022-08-02 LAB — LIPID PANEL
Chol/HDL Ratio: 4.9 ratio — ABNORMAL HIGH (ref 0.0–4.4)
Cholesterol, Total: 214 mg/dL — ABNORMAL HIGH (ref 100–199)
HDL: 44 mg/dL (ref >39)
LDL Chol Calc (NIH): 143 mg/dL — ABNORMAL HIGH (ref 0–99)
Triglycerides: 151 mg/dL — ABNORMAL HIGH (ref 0–149)
VLDL Cholesterol Cal: 27 mg/dL (ref 5–40)

## 2022-08-02 LAB — HEMOGLOBIN A1C
Est. average glucose Bld gHb Est-mCnc: 177 mg/dL
Hgb A1c MFr Bld: 7.8 % — ABNORMAL HIGH (ref 4.8–5.6)

## 2022-08-02 LAB — MICROALBUMIN / CREATININE URINE RATIO
Creatinine, Urine: 81.7 mg/dL
Microalb/Creat Ratio: 7 mg/g creat (ref 0–29)
Microalbumin, Urine: 5.5 ug/mL

## 2022-08-02 LAB — TSH RFX ON ABNORMAL TO FREE T4: TSH: 16.6 u[IU]/mL — ABNORMAL HIGH (ref 0.450–4.500)

## 2022-08-05 ENCOUNTER — Telehealth: Payer: Self-pay | Admitting: Family Medicine

## 2022-08-05 DIAGNOSIS — E038 Other specified hypothyroidism: Secondary | ICD-10-CM

## 2022-08-05 MED ORDER — LEVOTHYROXINE SODIUM 50 MCG PO TABS
50.0000 ug | ORAL_TABLET | Freq: Every day | ORAL | 1 refills | Status: DC
Start: 2022-08-05 — End: 2022-12-13

## 2022-08-05 NOTE — Telephone Encounter (Signed)
Called patient to discuss labwork.  Showing hypothyroidism, she is willing to try levothyroxine again, will start 50 mcg dose and discussed to monitor for side effects.  Discussed starting cholesterol medicine for T1DM with elevated LDL, she would like to wait at this time and work on lifestyle modifications. Will continue to monitor closely and discuss further at f/u.  Burley Saver MD

## 2022-10-03 ENCOUNTER — Encounter: Payer: Self-pay | Admitting: Pharmacist

## 2022-10-03 ENCOUNTER — Ambulatory Visit (INDEPENDENT_AMBULATORY_CARE_PROVIDER_SITE_OTHER): Payer: Medicaid Other | Admitting: Pharmacist

## 2022-10-03 VITALS — BP 115/72 | HR 87 | Ht 64.0 in | Wt 223.0 lb

## 2022-10-03 DIAGNOSIS — E1069 Type 1 diabetes mellitus with other specified complication: Secondary | ICD-10-CM

## 2022-10-03 NOTE — Patient Instructions (Addendum)
It was nice to see you today!  Your goal blood sugar is 80-130 before eating and less than 180 after eating.  Medication Changes: Continue rapid insulin Novolog (insulin aspart) to the following scale at mealtimes:  Glucose < 150 = 8 units Glucose 150-200 = 10 units Glucose > 200 = 12 units   Continue Lantus at 12 units twice daily   Monitor blood sugars at home and keep a log (glucometer or piece of paper) to bring with you to your next visit.  Keep up the good work with diet and exercise. Aim for a diet full of vegetables, fruit and lean meats (chicken, [place], fish). Try to limit salt intake by eating fresh or frozen vegetables (instead of canned), rinse canned vegetables prior to cooking and do not add any additional salt to meals.   Trial Chomps meat sticks as a protein snack before bed.

## 2022-10-03 NOTE — Progress Notes (Signed)
S:    Chief Complaint  Patient presents with   Medication Management    Type 1 Diabetes   37 y.o. female who presents for diabetes evaluation, education, and management.  PMH is significant for asthma and type 1 diabetes.  Patient was referred and last seen by Primary Care Provider, Dr. Miquel Dunn, on 08/01/22  At last visit, patient was Lantus (insulin glargine) increase to 12 units BID and Novolog (insulin aspart) by sliding scale at mealtimes.   Today, patient arrives in good spirits and presents without any assistance. Her kids have been at [employer] the past few days and she has been able to eat more vegetables for the past few days with them out of the house.  Current diabetes medications include: Lantus (insulin glargine) 12 units BID and Novolog (insulin aspart) per sliding scale (< 150 = 8 units, 150-200 = 10 units, > 200 = 12 units)  Patient reports adherence to taking all medications as prescribed.    Insurance coverage: [place] [employer]   Patient reports hypoglycemic events.  Patient denies nocturia (nighttime urination).  Patient denies neuropathy (nerve pain). Patient denies visual changes. Patient denies self foot exams.   Patient reported dietary habits: Eats 2 meals/day Lunch: cereal, oatmeal, biscuit Dinner: sandwich, pizza, spaghetti   Patient-reported exercise habits: Gets lots of physical activity taking care of her daughter and working outside in her garden.    O:   Review of Systems  Constitutional: Negative.   Genitourinary:  Negative for frequency.  All other systems reviewed and are negative.   Physical Exam Vitals reviewed.  Constitutional:      Appearance: Normal appearance.  Pulmonary:     Effort: Pulmonary effort is normal.     Breath sounds: Normal breath sounds.  Neurological:     Mental Status: She is alert.  Psychiatric:        Mood and Affect: Mood normal.        Behavior: Behavior normal.        Thought Content: Thought content normal.         Judgment: Judgment normal.     CGM Download:  % Time CGM is active: 97% Average Glucose: 160 mg/dL Glucose Management Indicator: 7.1%  Glucose Variability: 42.1% (goal <36%) Time in Goal:  - Time in range 70-180: 59% - Time above range: 34% - Time below range: 7% Observed patterns: frequent variability with overnight hypoglycemia trends   Lab Results  Component Value Date   HGBA1C 7.8 (H) 08/01/2022   Vitals:   10/03/22 1055  BP: 115/72  Pulse: 87  SpO2: 100%    Clinical Atherosclerotic Cardiovascular Disease (ASCVD): No  The ASCVD Risk score (Arnett DK, et al., 2019) failed to calculate for the following reasons:   The 2019 ASCVD risk score is only valid for ages 72 to 19   A/P: Diabetes longstanding currently closer to goal, but continues to have high glucose variability at 42.1% with frequent lows. Patient is able to verbalize appropriate hypoglycemia management plan. Medication adherence appears appropriate. Control has been improved over the past few days, as she has increased her vegetable intake with her kids not at home. Continues to present with intermittent overnight hypoglycemia.  -Continued dose of basal insulin Lantus (insulin glargine) from 12 units twice daily. Discussed option of lowering dose from 12 to 11 however patient agreed to continue without change at this time. -Continued rapid insulin Novolog (insulin aspart) to the following scale at mealtimes:  Glucose < 150 =  8 units Glucose 150-200 = 10 units Glucose > 200 = 12 units  -Patient educated on purpose, proper use, and potential adverse effects.  -Extensively discussed pathophysiology of diabetes, recommended lifestyle interventions, dietary effects on blood sugar control.  -Counseled on s/sx of and management of hypoglycemia.  Written patient instructions provided. Patient verbalized understanding of treatment plan.  Total time in face to face counseling 27 minutes.    Follow-up: Pharmacist  on 12/12/22 Patient seen with Lily Peer, PharmD, PGY-1 resident.

## 2022-10-03 NOTE — Assessment & Plan Note (Addendum)
Diabetes longstanding currently closer to goal, but continues to have high glucose variability at 42.1% with frequent lows. Patient is able to verbalize appropriate hypoglycemia management plan. Medication adherence appears appropriate. Control has been improved over the past few days, as she has increased her vegetable intake with her kids not at home. Continues to present with intermittent overnight hypoglycemia.  -Continued dose of basal insulin Lantus (insulin glargine) from 12 units twice daily. Discussed option of lowering dose from 12 to 11 however patient agreed to continue without change at this time.  -Continued rapid insulin Novolog (insulin aspart) to the following scale at mealtimes:  Glucose < 150 = 8 units Glucose 150-200 = 10 units Glucose > 200 = 12 units  -Patient educated on purpose, proper use, and potential adverse effects.  -Extensively discussed pathophysiology of diabetes, recommended lifestyle interventions, dietary effects on blood sugar control.  -Counseled on s/sx of and management of hypoglycemia.

## 2022-10-04 NOTE — Progress Notes (Signed)
Reviewed and agree with Dr Koval's plan.   

## 2022-11-04 ENCOUNTER — Telehealth: Payer: Self-pay | Admitting: Pharmacist

## 2022-11-04 DIAGNOSIS — E1069 Type 1 diabetes mellitus with other specified complication: Secondary | ICD-10-CM

## 2022-11-04 MED ORDER — NOVOLOG FLEXPEN 100 UNIT/ML ~~LOC~~ SOPN
PEN_INJECTOR | SUBCUTANEOUS | 11 refills | Status: DC
Start: 2022-11-04 — End: 2022-12-09

## 2022-11-04 NOTE — Telephone Encounter (Signed)
Patient calls the office and reports access issue with mealtime (novolog) insulin.   Contacted pharmacy on her behalf.  Determined the the insurance requires a max number of units per day added to prescription in addition to her sliding scale to allow for number of days supply calculation.   New prescription provided:  Novolog (insulin aspart) - Pre-meal glucose < 150 - inject 8 units,  150-200  10 units, 201-250  12 units, > 250 inject 15 units. Max daily dose 30 units.  Returned call to patient - communicated clarification of insulin prescription information had been completed.    Total time with patient call and documentation of interaction: 13 minutes.

## 2022-11-06 NOTE — Telephone Encounter (Signed)
Reviewed and agree with Dr Koval's plan.   

## 2022-12-09 ENCOUNTER — Telehealth: Payer: Self-pay | Admitting: Pharmacist

## 2022-12-09 DIAGNOSIS — E1069 Type 1 diabetes mellitus with other specified complication: Secondary | ICD-10-CM

## 2022-12-09 MED ORDER — NOVOLOG FLEXPEN 100 UNIT/ML ~~LOC~~ SOPN
8.0000 [IU] | PEN_INJECTOR | Freq: Three times a day (TID) | SUBCUTANEOUS | 11 refills | Status: DC
Start: 2022-12-09 — End: 2023-12-05

## 2022-12-09 NOTE — Telephone Encounter (Signed)
Patient called office and left message related to short-supply of her Novolog.  She has follow-up scheduled in our clinic later this week.   Returned call to patient.  She reports taking doses 10-15 units up to three times daily. This has resulted in her taking more than the previously prescribed maximum of 30 units daily.   New prescription sent to pharmacy allowing for higher daily maximum total (more than 30 units).    Contacted pharmacy to clarify and provide updated prescription with dosing of 8-15 units TID.  Continue Lantus (insulin glargine) 10 units BID.     Updated patient with return call.  She plans to pick-up later today.    Total time with patient call and documentation of interaction: 27 minutes.  F/U in clinic in 3 days.

## 2022-12-11 NOTE — Telephone Encounter (Signed)
Reviewed and agree with Dr Koval's plan.   

## 2022-12-12 ENCOUNTER — Encounter: Payer: Self-pay | Admitting: Pharmacist

## 2022-12-12 ENCOUNTER — Ambulatory Visit (INDEPENDENT_AMBULATORY_CARE_PROVIDER_SITE_OTHER): Payer: Medicaid Other | Admitting: Pharmacist

## 2022-12-12 VITALS — BP 111/84 | HR 86 | Ht 64.0 in | Wt 218.8 lb

## 2022-12-12 DIAGNOSIS — E1069 Type 1 diabetes mellitus with other specified complication: Secondary | ICD-10-CM | POA: Diagnosis not present

## 2022-12-12 DIAGNOSIS — E039 Hypothyroidism, unspecified: Secondary | ICD-10-CM | POA: Diagnosis not present

## 2022-12-12 DIAGNOSIS — E038 Other specified hypothyroidism: Secondary | ICD-10-CM | POA: Diagnosis not present

## 2022-12-12 MED ORDER — INSULIN GLARGINE 100 UNIT/ML SOLOSTAR PEN
12.0000 [IU] | PEN_INJECTOR | Freq: Two times a day (BID) | SUBCUTANEOUS | 11 refills | Status: DC
Start: 2022-12-12 — End: 2023-12-05

## 2022-12-12 NOTE — Progress Notes (Signed)
S:     Chief Complaint  Patient presents with   Medication Management    Type 1 Diabetes Follow Up   37 y.o. female who presents for diabetes evaluation, education, and management. Patient arrives in good spirits and presents without any assistance.  Patient was referred and last seen by Primary Care Provider, Dr. Miquel Dunn, on 08/01/22 Patient was last seen in pharmacy clinic on 10/03/22.   PMH is significant for Type 1 Diabetes and asthma.  At last visit, continued insulin medication regimen.   Family/Social History: patient states high stress level for past month due to "everything in my house breaking at once"  Current diabetes medications include: Lantus (insulin glargine) 12 units twice daily, Novolog (insulin aspart) 8-15 units three times daily with meals per sliding scale (< 150 = 8 units, 150-200 = 10 units, 201-250 = 12 units, >250 = 15 units)  Current hypertension medications include: none  Current hyperlipidemia medications include: none  Patient reports adherence to taking all medications as prescribed.    Do you feel that your medications are working for you? yes Have you been experiencing any side effects to the medications prescribed? no Do you have any problems obtaining medications due to transportation or finances? no Insurance coverage: Medicaid  Patient reports hypoglycemic events. Patient reports overnight lows of readings in the 50s-60s.  O:   Review of Systems  All other systems reviewed and are negative.   Physical Exam Vitals reviewed.  Constitutional:      Appearance: Normal appearance.  Neurological:     Mental Status: She is alert.  Psychiatric:        Mood and Affect: Mood normal.        Behavior: Behavior normal.        Thought Content: Thought content normal.        Judgment: Judgment normal.    Libre3 CGM Download today % Time CGM is active: 93% Average Glucose: 175 mg/dL Glucose Management Indicator: 7.5  Glucose Variability: 47%  (goal <36%) Time in Goal:  - Time in range 70-180: 57% - Time above range: 39% - Time below range: 4% Observed patterns:   Lab Results  Component Value Date   HGBA1C 7.8 (H) 08/01/2022   Vitals:   12/12/22 0950  BP: 111/84  Pulse: 86  SpO2: 100%    Lipid Panel     Component Value Date/Time   CHOL 214 (H) 08/01/2022 1152   TRIG 151 (H) 08/01/2022 1152   HDL 44 08/01/2022 1152   CHOLHDL 4.9 (H) 08/01/2022 1152   LDLCALC 143 (H) 08/01/2022 1152    Clinical Atherosclerotic Cardiovascular Disease (ASCVD): No  The ASCVD Risk score (Arnett DK, et al., 2019) failed to calculate for the following reasons:   The 2019 ASCVD risk score is only valid for ages 18 to 43   Patient is participating in a Managed Medicaid Plan:  Yes   A/P: Type 1 diabetes longstanding (since age 42 years old) currently uncontrolled but improving. Patient is able to verbalize appropriate hypoglycemia management plan. Medication adherence appears optimal. Patient experiencing some hyperglycemia (likely due to recent stress) and overnight hypoglycemia (likely due to too much Lantus [insulin aspart] with dinner).  -Continued basal insulin Lantus (insulin glargine) units 12 units twice daily.  -Continued rapid insulin Novolog (insulin aspart) 8-15 units three times daily with meals per sliding scale (< 150 = 8 units, 150-200 = 10 units, 201-250 = 12 units, >250 = 15 units). Advised to avoid no  more than 12 units with evening meal to minimize risk of overnight hypoglycemic events. -Patient educated on purpose, proper use, and potential adverse effects.  -Extensively discussed pathophysiology of diabetes, recommended lifestyle interventions, dietary effects on blood sugar control.  -Counseled on s/sx of and management of hypoglycemia.  -Next A1c anticipated next month, September 2024.  Hypothyroidism - (levothyroxine) prescription expired 12/03/22. Patient estimates having 8 days supply remaining. Discussed with Dr.  Miquel Dunn, and ordered TSH lab to be completed today. Dr. Miquel Dunn will follow up on results and adjust medication management/ send new prescription  Written patient instructions provided. Patient verbalized understanding of treatment plan.  Total time in face to face counseling 24 minutes.    Follow-up:  Pharmacist PRN, if able to download Libre3 phone app, schedule appt to switch from Libre2 CGM to Libre3 Plus CGM and sensors PCP clinic visit in to be determined Patient seen with Rickey Primus, PharmD Candidate and Andee Poles, PharmD Candidate.

## 2022-12-12 NOTE — Assessment & Plan Note (Signed)
Type 1 diabetes longstanding (since age 37 years old) currently uncontrolled but improving. Patient is able to verbalize appropriate hypoglycemia management plan. Medication adherence appears optimal. Patient experiencing some hyperglycemia (likely due to recent stress) and overnight hypoglycemia (likely due to too much Lantus [insulin aspart] with dinner).  -Continued basal insulin Lantus (insulin glargine) units 12 units twice daily.  -Continued rapid insulin Novolog (insulin aspart) 8-15 units three times daily with meals per sliding scale (< 150 = 8 units, 150-200 = 10 units, 201-250 = 12 units, >250 = 15 units). Advised to avoid no more than 12 units with evening meal to minimize risk of overnight hypoglycemic events. -Patient educated on purpose, proper use, and potential adverse effects.  -Extensively discussed pathophysiology of diabetes, recommended lifestyle interventions, dietary effects on blood sugar control.  -Counseled on s/sx of and management of hypoglycemia.  -Next A1c anticipated next month, September 2024.

## 2022-12-12 NOTE — Patient Instructions (Addendum)
It was nice to see you today!  Keep checking the [employer] on your phone to see if you are able to download the [employer] app on your phone. If you find you are able to download this app, you can schedule an appointment with me to change to the Libre3 glucose sensor.  Your goal blood sugar is 80-130 before eating and less than 180 after eating.  Medication Changes: Continue all other medication the same.  Monitor blood sugars at home and keep a log (glucometer or piece of paper) to bring with you to your next visit.  Keep up the good work with diet and exercise. Aim for a diet full of vegetables, fruit and lean meats (chicken, [place], fish). Try to limit salt intake by eating fresh or frozen vegetables (instead of canned), rinse canned vegetables prior to cooking and do not add any additional salt to meals.

## 2022-12-13 ENCOUNTER — Telehealth: Payer: Self-pay | Admitting: Family Medicine

## 2022-12-13 DIAGNOSIS — E038 Other specified hypothyroidism: Secondary | ICD-10-CM

## 2022-12-13 LAB — TSH: TSH: 12 u[IU]/mL — ABNORMAL HIGH (ref 0.450–4.500)

## 2022-12-13 MED ORDER — LEVOTHYROXINE SODIUM 75 MCG PO TABS: 75.0000 ug | ORAL_TABLET | ORAL | 0 refills | Status: DC

## 2022-12-13 NOTE — Progress Notes (Signed)
Reviewed and agree with Dr Koval's plan.   

## 2022-12-13 NOTE — Telephone Encounter (Signed)
Debra Howell saw Dr Raymondo Band yesterday and noted almost out of her home thyroid medications. TSH checked.  Called patient today, discussed TSH improved but not yet normal. She notes she has been tolerating the 50 mcg well without side effects. Discussed increasing to the 75 mcg dose which she is agreeable to (she prefers small dose increases due to side effects of medications in the past). Will start levothyroxine 75 mcg daily, and repeat TSH in 6-8 weeks.  Debra Saver MD

## 2023-02-13 ENCOUNTER — Telehealth: Payer: Self-pay | Admitting: Pharmacist

## 2023-02-13 MED ORDER — FREESTYLE LIBRE 3 READER DEVI: 1.0000 | Freq: Once | 0 refills | Status: AC

## 2023-02-13 MED ORDER — FREESTYLE LIBRE 3 PLUS SENSOR MISC: 11 refills | Status: DC

## 2023-02-13 NOTE — Telephone Encounter (Signed)
Patient calls in follow-up of need for new prescription for CGM - Libre 2/3 and reader.   Since last contact patient reports she is having trouble obtaining Libre 2 CGM and her reader appears to be malfunctioning.   New prescription for [facility] 3 PLUS sensors and [employer] 3 reader.   Majority of time below was awaiting pharmacy call back - "with your shortly" Total time with patient call and documentation of interaction: 27 minutes.   Contacted patient to share that [place] 3 reader requires PA She is aware that the 3 Plus is smaller and does not require swiping.   I will ask Siri Cole, CPhT to help facilitate PA F/U Phone call planned: None

## 2023-02-14 ENCOUNTER — Telehealth: Payer: Self-pay

## 2023-02-14 ENCOUNTER — Other Ambulatory Visit (HOSPITAL_COMMUNITY): Payer: Self-pay

## 2023-02-14 NOTE — Telephone Encounter (Signed)
Reviewed and agree with Dr Koval's plan.   

## 2023-02-14 NOTE — Telephone Encounter (Signed)
Pharmacy Patient Advocate Encounter   Received notification from  [employer]  that prior authorization for FREESTYLE LIBRE 3 PLUS SENSORS is required/requested.   PA required; PA submitted to above mentioned insurance via CoverMyMeds Key/confirmation #/[id number] Status is pending

## 2023-02-17 NOTE — Telephone Encounter (Signed)
Pharmacy Patient Advocate Encounter  Received notification from [facility] Medicaid that Prior Authorization for FREESTYLE LIBRE 3 PLUS SENSOR has been APPROVED from 02/14/23 to 02/14/24   PA #/Case ID/Reference #: [id number]

## 2023-02-18 ENCOUNTER — Other Ambulatory Visit (HOSPITAL_COMMUNITY): Payer: Self-pay

## 2023-02-18 ENCOUNTER — Telehealth: Payer: Self-pay | Admitting: Pharmacist

## 2023-02-18 MED ORDER — FREESTYLE LIBRE 3 READER DEVI: 1.0000 | Freq: Once | 0 refills | Status: AC

## 2023-02-18 NOTE — Telephone Encounter (Addendum)
Patient reports she now has sensors but did not get new reader.  Left message requesting help.  Contacted patient's pharmacy and inquired about potential new reader given history of Libre 2 use in type 1 Diabetes and reported dysfunction of Libre 2 reader.  Pharmacy staff reports that a PA is required and will send to our office for review.

## 2023-02-21 ENCOUNTER — Telehealth: Payer: Self-pay

## 2023-02-21 NOTE — Telephone Encounter (Signed)
Pharmacy Patient Advocate Encounter   Received notification from CoverMyMeds that prior authorization for FREESTYLE LIBRE 3 READER is required/requested.   Insurance verification completed.   The patient is insured through [facility] [employer] [place] .   Per test claim: PA required; PA submitted to above mentioned insurance via CoverMyMeds Key/confirmation #/EOC [id number]. Status is pending

## 2023-02-24 NOTE — Telephone Encounter (Signed)
Confirmed authorization and prescription ready for pick-up of both sensors and reader with pharmacy.   Attempted contact with patient, Left HIPAA compliant voice mail sharing that her Debra Howell 3 was ready for pick-up.  If any questions, suggested call back to direct phone: [phone]  Total time with patient call and documentation of interaction: 13 minutes.

## 2023-02-24 NOTE — Telephone Encounter (Signed)
Reviewed and agree with Dr Koval's plan.   

## 2023-02-28 NOTE — Telephone Encounter (Signed)
Pharmacy Patient Advocate Encounter  Received notification from [facility] Medicaid that Prior Authorization for FREESTYLE LIBRE 3 READER has been APPROVED from 02/21/23 to 02/21/24

## 2023-03-03 ENCOUNTER — Telehealth: Payer: Self-pay | Admitting: Pharmacist

## 2023-03-03 NOTE — Telephone Encounter (Signed)
Patient calls to report she is still unable to get her Libre 3 READER.   She returns call and reports that she now has her CGM READER.  She thanked me for the assistance.

## 2023-03-09 ENCOUNTER — Other Ambulatory Visit: Payer: Self-pay | Admitting: Family Medicine

## 2023-03-09 DIAGNOSIS — E038 Other specified hypothyroidism: Secondary | ICD-10-CM

## 2023-03-10 NOTE — Telephone Encounter (Signed)
Attempted to call patient to inform her about refills on her medication but needs to make an appointment per Dr. Manson Passey request for a repeat TSH.  Left a generic VM for patient to call office back.  When patient calls back please make an appointment for patient. Thanks  Drusilla Kanner, CMA

## 2023-03-10 NOTE — Telephone Encounter (Signed)
Patient needs visit and repeat TSH. Nursing--please let her know I sent in 2 weeks of medication but needs office visit.  Debra Starr, MD  Family Medicine Teaching Service

## 2023-05-12 ENCOUNTER — Other Ambulatory Visit (HOSPITAL_COMMUNITY): Payer: Self-pay

## 2023-07-09 IMAGING — CT CT ANKLE*R* W/O CM
1 series · 12 of 14 positions shown, 15 images · non-contrast
Comparison: Right ankle x-rays dated May 09, 2021.

CLINICAL DATA: Recent right ankle injury.

EXAM:
CT OF THE RIGHT ANKLE WITHOUT CONTRAST
TECHNIQUE: Multidetector CT imaging of the right ankle was performed according
to the standard protocol. Multiplanar CT image reconstructions were
also generated.
RADIATION DOSE REDUCTION: This exam was performed according to the
departmental dose-optimization program which includes automated
exposure control, adjustment of the mA and/or kV according to
patient size and/or use of iterative reconstruction technique.

[Series 4: right ankle 1.5 ax st · axial · 0.24mm/px · z∈[-1034,-899]mm · 12 of 108 slices shown, 15 images]
[im 9/108  soft-tissue]
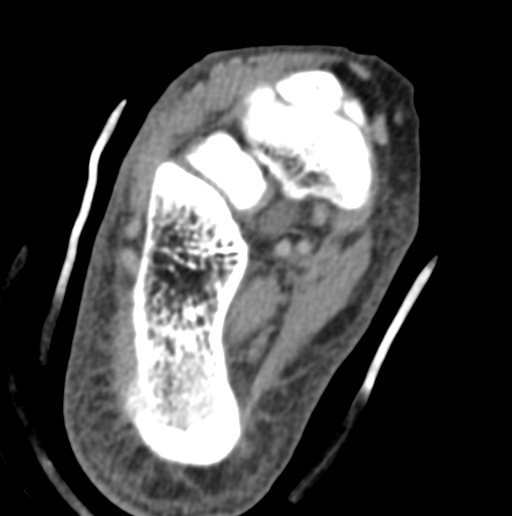
[im 9/108  bone]
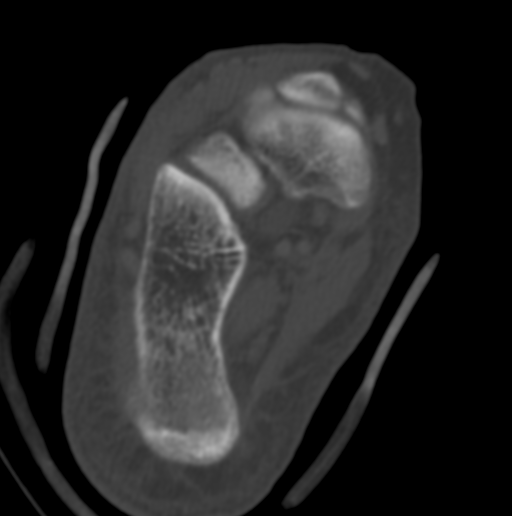
[im 17/108  bone]
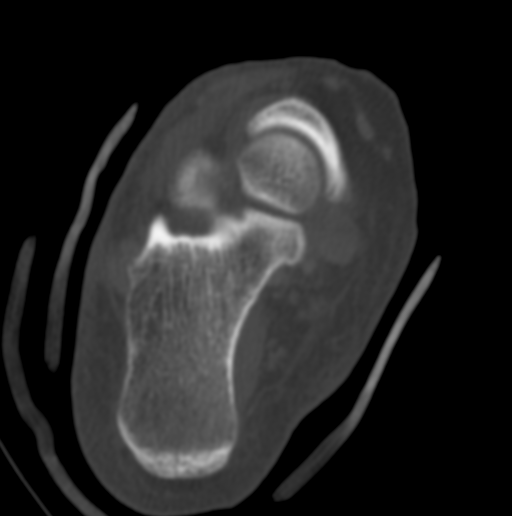
[im 25/108  bone]
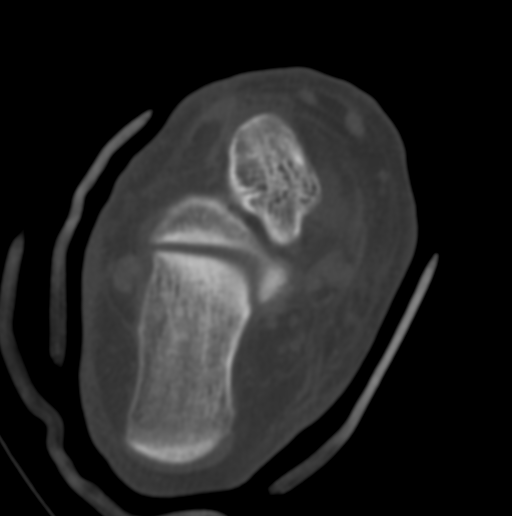
[im 33/108  bone]
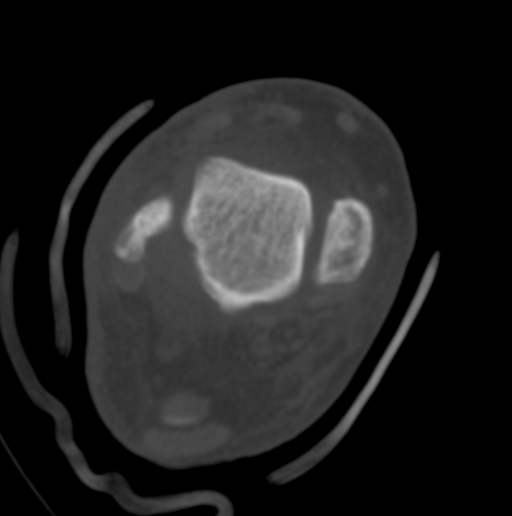
[im 42/108  soft-tissue]
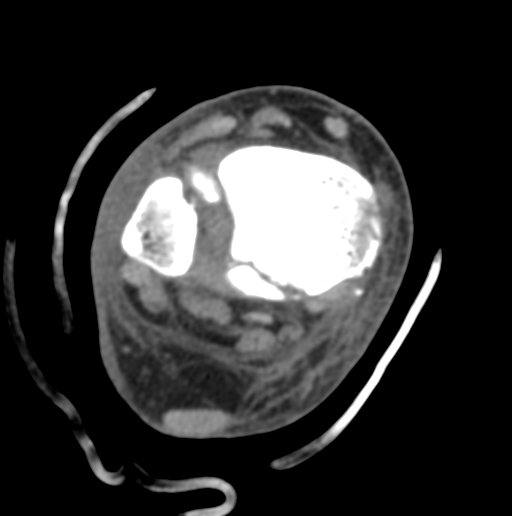
[im 42/108  bone]
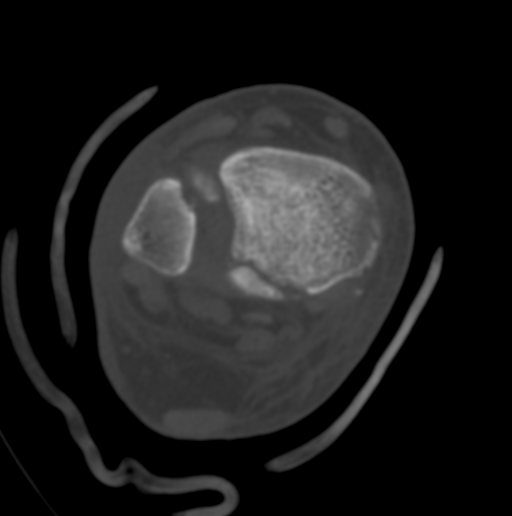
[im 50/108  bone]
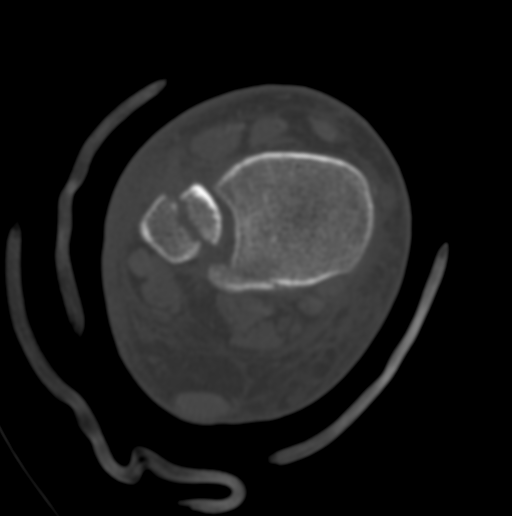
[im 58/108  bone]
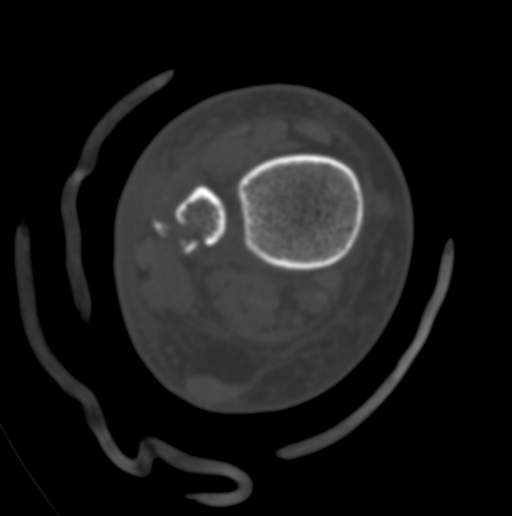
[im 66/108  bone]
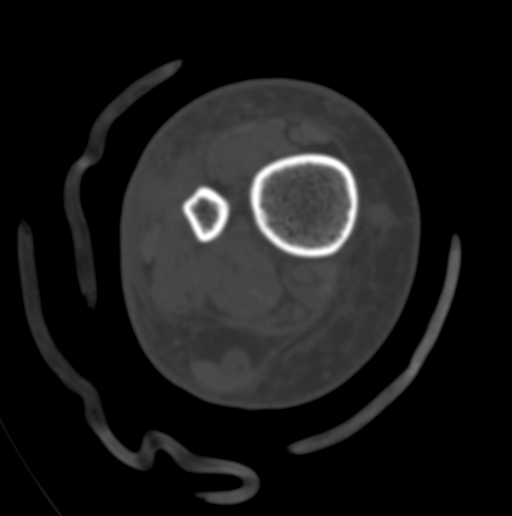
[im 75/108  soft-tissue]
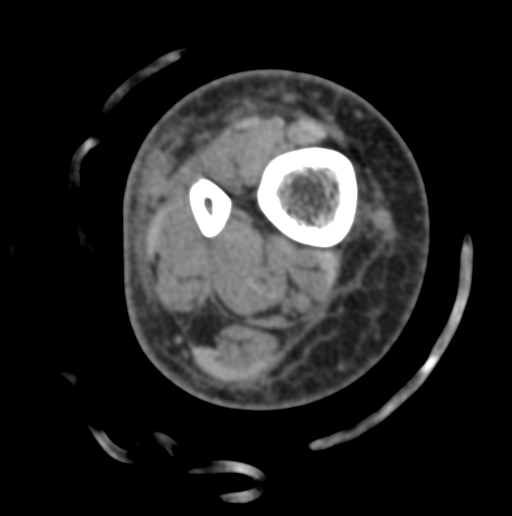
[im 75/108  bone]
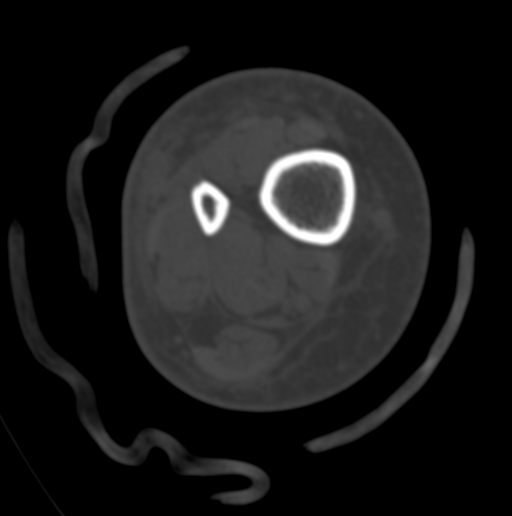
[im 83/108  bone]
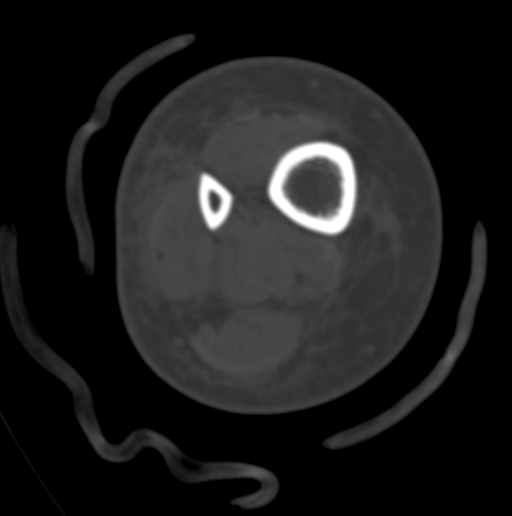
[im 91/108  bone]
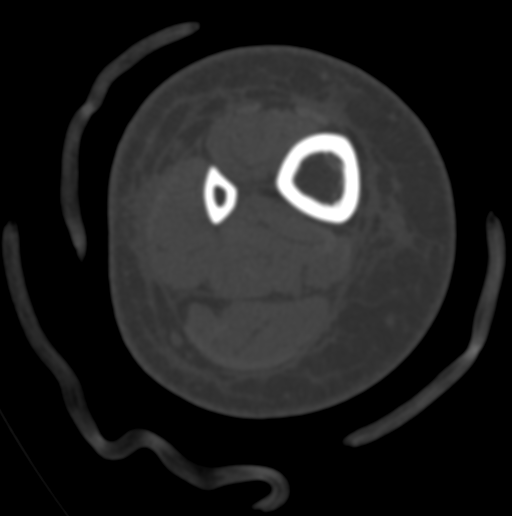
[im 99/108  bone]
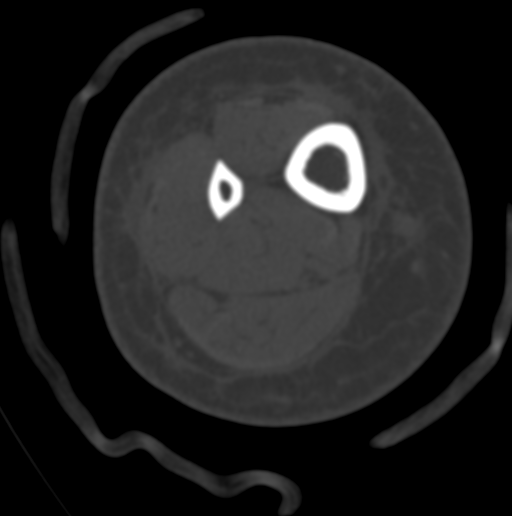

[12 of 14 positions shown; findings below may reference images not displayed]

FINDINGS: Bones/Joint/Cartilage

Acute minimally displaced fracture of the medial malleolus with 5 mm
distraction and 2 mm medial displacement. Acute comminuted oblique
fracture of the distal fibular metaphysis with 5 mm lateral
displacement. Acute minimally displaced fracture of the posterior
malleolus with 3 mm lateral displacement. Mild lateral subluxation
of the talar dome with respect to the tibial plafond. No
dislocation.

The ankle mortise is symmetric. The talar dome is intact. Joint
spaces are preserved. Small tibiotalar joint effusion.

Ligaments

Ligaments are suboptimally evaluated by CT.

Muscles and Tendons
Grossly intact.

Soft tissue
Bimalleolar soft tissue swelling. No fluid collection or hematoma.
No soft tissue mass.
IMPRESSION: 1. Acute trimalleolar fracture as described above.

## 2023-11-04 ENCOUNTER — Ambulatory Visit: Admitting: Pharmacist

## 2023-12-05 ENCOUNTER — Encounter: Payer: Self-pay | Admitting: Family Medicine

## 2023-12-05 ENCOUNTER — Encounter: Payer: Self-pay | Admitting: Pharmacist

## 2023-12-05 ENCOUNTER — Ambulatory Visit: Admitting: Family Medicine

## 2023-12-05 ENCOUNTER — Ambulatory Visit: Admitting: Pharmacist

## 2023-12-05 VITALS — BP 130/73 | HR 95 | Wt 208.0 lb

## 2023-12-05 VITALS — BP 130/73 | HR 95 | Ht 64.0 in | Wt 208.0 lb

## 2023-12-05 DIAGNOSIS — E038 Other specified hypothyroidism: Secondary | ICD-10-CM

## 2023-12-05 DIAGNOSIS — E1069 Type 1 diabetes mellitus with other specified complication: Secondary | ICD-10-CM

## 2023-12-05 DIAGNOSIS — E039 Hypothyroidism, unspecified: Secondary | ICD-10-CM

## 2023-12-05 DIAGNOSIS — Z1159 Encounter for screening for other viral diseases: Secondary | ICD-10-CM | POA: Diagnosis not present

## 2023-12-05 LAB — POCT GLYCOSYLATED HEMOGLOBIN (HGB A1C): HbA1c, POC (controlled diabetic range): 7.1 % — AB (ref 0.0–7.0)

## 2023-12-05 MED ORDER — INSULIN GLARGINE 100 UNIT/ML SOLOSTAR PEN: 8.0000 [IU] | PEN_INJECTOR | Freq: Two times a day (BID) | SUBCUTANEOUS | 11 refills | Status: AC

## 2023-12-05 MED ORDER — DEXCOM G7 SENSOR MISC: 1.0000 | 11 refills | Status: AC

## 2023-12-05 MED ORDER — INSULIN PEN NEEDLE 32G X 4 MM MISC: 100.0000 | 0 refills | Status: AC | PRN

## 2023-12-05 MED ORDER — LEVOTHYROXINE SODIUM 75 MCG PO TABS: 75.0000 ug | ORAL_TABLET | Freq: Every day | ORAL | 1 refills | Status: DC

## 2023-12-05 MED ORDER — NOVOLOG FLEXPEN 100 UNIT/ML ~~LOC~~ SOPN: 8.0000 [IU] | PEN_INJECTOR | Freq: Three times a day (TID) | SUBCUTANEOUS | 11 refills | Status: AC

## 2023-12-05 NOTE — Progress Notes (Signed)
    SUBJECTIVE:   CHIEF COMPLAINT / HPI:   Discussed the use of AI scribe software for clinical note transcription with the patient, who gave verbal consent to proceed.  History of Present Illness Debra Howell is a 38 year old female with type 1 diabetes and hypothyroidism who presents for medication management and disability documentation. She is accompanied by her mother who assists with her pharmacy decisions.  Glycemic variability - Type 1 diabetes managed with Lantus , 8 units in the morning and evening - Novolog , 8 to 15 units with lunch and dinner, and 8 to 12 units with breakfast - just saw Dr Amalia and got new sensors placed,  T2DM- 48% variability. CGM A1c around 6.0 - Experiences variability in blood glucose levels - No eye exam in the past year  Hypothyroidism medication nonadherence - Has been without Synthroid  for the past month due to not refilling prescription - Previously taking 75 mcg daily - Prefers to use [facility] pharmacy despite difficulties obtaining medication  Impaired mobility and pain - History of fractured right ankle causing difficulty with lifting and mobility, especially on stairs and soft surfaces such as sand - Slow on stairs and experiences pain on soft sand - No recent falls or injuries - No numbness or tingling in feet  History of childhood asthma - No current wheezing - No inhaler use    PERTINENT  PMH / PSH: T1DM, Hypothyroidism, asthma  OBJECTIVE:   BP 130/73   Pulse 95   Ht 5' 4 (1.626 m)   Wt 208 lb (94.3 kg)   SpO2 100%   BMI 35.70 kg/m   Physical Exam General: A&O, NAD HEENT: No sign of trauma, EOM grossly intact Cardiac: RRR, no m/r/g Respiratory: CTAB, normal WOB, no w/c/r GI: Soft, NTTP, non-distended  Extremities: NTTP, no peripheral edema. Neuro: Normal gait, moves all four extremities appropriately. Psych: Appropriate mood and affect   ASSESSMENT/PLAN:   Assessment & Plan Type 1 diabetes mellitus with  variable blood glucose Blood glucose levels are variable. Emphasized the importance of stabilizing blood glucose levels to reduce complications. - Continue Lantus  8 units in the morning and 8 units in the evening. - Continue Novolog  8-15 units with lunch and dinner, and 8-12 units with breakfast. - Ordered A1c, BMP, urine microalbumin creatinine ratio - Recommend annual eye exam. - PCV 20 vaccine today  Hypothyroidism Ran out of levothyroxine  for the past month. Thyroid function not checked today due to recent lapse in medication. Plan to restart medication and re-evaluate thyroid function in four weeks. - Prescribe levothyroxine  75 mcg daily. - Send prescription to [facility]. - Schedule lab appointment in four weeks to check thyroid function.  Status post trimalleolar fracture of right ankle Ankle pain exacerbated by walking on soft sand and using stairs. No recent falls or injuries. Ankle does not swell and is generally manageable with ibuprofen  as needed. - Continue ibuprofen  as needed for pain management.  General Health Maintenance Recommended PCV20 pneumonia vaccine due to increased risk from type 1 diabetes. Discussed that it is a one-time vaccine protecting against nearly 20 common bacteria causing pneumonia. - Administer PCV20 pneumonia vaccine.    Debra FORBES Keeling, MD [facility]

## 2023-12-05 NOTE — Assessment & Plan Note (Signed)
 Type 1 diabetes longstanding (since 38 years old) currently controlled with GMI 6.7% but high variability of 48.5% and TBR 18%. Patient is able to verbalize appropriate hypoglycemia management plan. Medication adherence appears good. Control is suboptimal due to regimen. -Decreased basal insulin  Lantus  (insulin  glargine)  from 10 units BID to 8 units BID in the morning.  -Decreased rapid insulin  Novolog  (insulin  aspart) from 10-15 units 2-3 times daily to 8-12 units with breakfast (if eating) and 8-15 units at lunch and dinner.  -Switched patient CGM from [place] to [place] G7 system. Educated patient on new receiver and sensor, and demonstrated how to place and pair new sensor. Provided Dexcom G7 Receiver and 3 additional sensors. -Sent in prescriptions for Dexcom G7 sensors, pen needles and both insulins. -Continued all other medications. Restart Synthroid  (levothyroxine ) pending visit with Dr. Donzetta. -Patient educated on purpose, proper use, and potential adverse effects.  -Extensively discussed pathophysiology of diabetes, recommended lifestyle interventions, dietary effects on blood sugar control.  -Counseled on s/sx of and management of hypoglycemia.  -Next A1c anticipated today.  -Order UACR. ASCVD risk - primary prevention in patient with diabetes. Last LDL 143 mg/dL on 5/81/75 not at goal of <70 mg/dL. ASCVD risk factors include T1DM and Hyperlipidemia. Moderate to high intensity statin reasonable to consider with longstanding T1DM (22 years). -Order fasting lipid panel. Reevaluate need for statin therapy pending results.

## 2023-12-05 NOTE — Progress Notes (Signed)
 S:     Chief Complaint  Patient presents with  . Medication Management    T1DM and hypothyroidism f/u   38 y.o. female who presents for diabetes evaluation, education, and management. Patient arrives in good spirits and presents without any assistance. Patient is accompanied by her mother.   Patient was referred and last seen by Primary Care Provider, Dr. Donzetta, on 08/01/22.  Last seen in pharmacy clinic on 12/12/22.   PMH is significant for Type 1 Diabetes and asthma.  Patient reports Diabetes was diagnosed in at age 69.   Reports today difficulty with Freestyle Libre sensors falling off.   Current diabetes medications include: Novolog  (insulin  aspart) 8-15 units TIDac, Lantus  (insulin  glargine) 12 units BID. Reports taking Novolog  (insulin  aspart) 10-15 units 2-3 times a day (sometimes doesn't eat breakfast), Lantus  (insulin  glargine) 10 units BID  Reports she has been out of her Synthroid  (levothyroxine ) for over a month.  Do you feel that your medications are working for you? yes Have you been experiencing any side effects to the medications prescribed? no Insurance coverage: [place] Medicaid  Patient reports hypoglycemic events. Reports headaches, verbalizes appropriate hypoglycemic management plan. Denies dizziness, shakiness, sweating.   Patient denies nocturia (nighttime urination).  Patient denies neuropathy (nerve pain). Patient denies visual changes. Patient denies self foot exams.   Patient reported dietary habits: Eats 2-3 meals/day Breakfast: sometimes skips, cereal with milk Lunch: sandwich Dinner: biggest meal of day. Spaghetti, pizza Snacks: doesn't snack unless low Drinks: water, diet dr. nunzio   O:   Review of Systems  All other systems reviewed and are negative.   Physical Exam Constitutional:      Appearance: Normal appearance.  Pulmonary:     Effort: Pulmonary effort is normal.  Neurological:     Mental Status: She is alert.  Psychiatric:         Mood and Affect: Mood normal.        Behavior: Behavior normal.    Libre3 CGM Download today 12/05/23 % Time CGM is active: 65% Average Glucose: 140 mg/dL Glucose Management Indicator: 6.7  Glucose Variability: 48.5% (goal <36%) Time in Goal:  - Time in range 70-180: 53% - Time above range: 29% - Time below range: 18% (Low: 12%, Very Low 6%) Observed patterns: high variability with lows overnight and afternoon/evening  Lab Results  Component Value Date   HGBA1C 7.8 (H) 08/01/2022   Vitals:   12/05/23 0918  BP: 130/73  Pulse: 95  SpO2: 100%    Lipid Panel     Component Value Date/Time   CHOL 214 (H) 08/01/2022 1152   TRIG 151 (H) 08/01/2022 1152   HDL 44 08/01/2022 1152   CHOLHDL 4.9 (H) 08/01/2022 1152   LDLCALC 143 (H) 08/01/2022 1152     Patient is participating in a Managed Medicaid Plan:  Yes   A/P: Type 1 diabetes longstanding (since 38 years old) currently controlled with GMI 6.7% but high variability of 48.5% and TBR 18%. Patient is able to verbalize appropriate hypoglycemia management plan. Medication adherence appears good. Control is suboptimal due to regimen. -Decreased basal insulin  Lantus  (insulin  glargine)  from 10 units BID to 8 units BID in the morning.  -Decreased rapid insulin  Novolog  (insulin  aspart) from 10-15 units 2-3 times daily to 8-12 units with breakfast (if eating) and 8-15 units at lunch and dinner.  -Switched patient CGM from [place] to [place] G7 system. Educated patient on new receiver and sensor, and demonstrated how to place and  pair new sensor. Provided Dexcom G7 Receiver and 3 additional sensors. -Sent in prescriptions for Dexcom G7 sensors, pen needles and both insulins. -Continued all other medications. Restart Synthroid  (levothyroxine ) pending visit with Dr. Donzetta. -Patient educated on purpose, proper use, and potential adverse effects.  -Extensively discussed pathophysiology of diabetes, recommended lifestyle interventions, dietary  effects on blood sugar control.  -Counseled on s/sx of and management of hypoglycemia.  -Next A1c anticipated today.  -Order UACR.  ASCVD risk - primary prevention in patient with diabetes. Last LDL 143 mg/dL on 5/81/75 not at goal of <70 mg/dL. ASCVD risk factors include T1DM and Hyperlipidemia. Moderate to high intensity statin reasonable to consider with longstanding T1DM (22 years). -Order fasting lipid panel. Reevaluate need for statin therapy pending results.  Written patient instructions provided. Patient verbalized understanding of treatment plan.  Total time in face to face counseling 27 minutes.    Follow-up:  Pharmacist TBD (~1 month) PCP clinic visit today 12/05/23 Patient seen with [name], PharmD Candidate - PY3 student and [name], PharmD Candidate - PY4 student.

## 2023-12-05 NOTE — Patient Instructions (Addendum)
 It was nice to see you today!  Enjoy your Dexcom G7 sensors!  Your goal blood sugar is 80-130 before eating and less than 180 after eating.  Medication Changes: Decrease Lantus  (insulin  glargine) to 8 units in AM and 8 units in PM  Decrease Novolog  (insulin  aspart) to 8-15 units with lunch and dinner. Use Novolog  (insulin  aspart) at 8-12 units with breakfast.  RESTART Synthroid  (levothyroxine ) -  Dr. Donzetta  Continue all other medication the same.   Keep up the good work with diet and exercise. Aim for a diet full of vegetables, fruit and lean meats (chicken, [place], fish). Try to limit salt intake by eating fresh or frozen vegetables (instead of canned), rinse canned vegetables prior to cooking and do not add any additional salt to meals.

## 2023-12-05 NOTE — Patient Instructions (Addendum)
 It was wonderful to see you today.  Please bring ALL of your medications with you to every visit.   Today we talked about:  - To apply for disability: please start this process online! [email or web address]  - I resent in your thyroid medication, lets recheck it 4-6 weeks, as well as urine and other tests.  - Please go get your eyes examined  We will do your pneumonia vaccine today.   Thank you for choosing [facility].   Please call [phone] with any questions about today's appointment.  Please arrive at least 15 minutes prior to your scheduled appointments.   If you had blood work today, I will send you a MyChart message or a letter if results are normal. Otherwise, I will give you a call.   If you had a referral placed, they will call you to set up an appointment. Please give us  a call if you don't hear back in the next 2 weeks.   If you need additional refills before your next appointment, please call your pharmacy first.  Don't forget to check out the [facility] at [address] [phone] Affordable prices on prescriptions and over-the-counter items, as well as services like vaccinations and medication home delivery.   Rollene Keeling, MD  Family Medicine

## 2023-12-05 NOTE — Progress Notes (Signed)
 Reviewed and agree with Dr Rennis plan.

## 2023-12-06 LAB — BASIC METABOLIC PANEL WITH GFR
BUN/Creatinine Ratio: 17 (ref 9–23)
BUN: 12 mg/dL (ref 6–20)
CO2: 20 mmol/L (ref 20–29)
Calcium: 9.5 mg/dL (ref 8.7–10.2)
Chloride: 102 mmol/L (ref 96–106)
Creatinine, Ser: 0.72 mg/dL (ref 0.57–1.00)
Glucose: 105 mg/dL — ABNORMAL HIGH (ref 70–99)
Potassium: 3.9 mmol/L (ref 3.5–5.2)
Sodium: 140 mmol/L (ref 134–144)
eGFR: 110 mL/min/1.73 (ref >59)

## 2023-12-06 LAB — LIPID PANEL
Chol/HDL Ratio: 3.9 ratio (ref 0.0–4.4)
Cholesterol, Total: 212 mg/dL — ABNORMAL HIGH (ref 100–199)
HDL: 55 mg/dL (ref >39)
LDL Chol Calc (NIH): 146 mg/dL — ABNORMAL HIGH (ref 0–99)
Triglycerides: 63 mg/dL (ref 0–149)
VLDL Cholesterol Cal: 11 mg/dL (ref 5–40)

## 2023-12-06 LAB — HEPATITIS C ANTIBODY: Hep C Virus Ab: NONREACTIVE

## 2023-12-08 ENCOUNTER — Ambulatory Visit: Payer: Self-pay | Admitting: Family Medicine

## 2023-12-09 ENCOUNTER — Telehealth: Payer: Self-pay | Admitting: Family Medicine

## 2023-12-09 DIAGNOSIS — E1069 Type 1 diabetes mellitus with other specified complication: Secondary | ICD-10-CM

## 2023-12-09 MED ORDER — ATORVASTATIN CALCIUM 40 MG PO TABS: 40.0000 mg | ORAL_TABLET | Freq: Every day | ORAL | 3 refills | Status: AC

## 2023-12-09 NOTE — Telephone Encounter (Signed)
 Called pt and left HIPAA compliant voicemail for patient to call back.  If patient calls back - Please let her know her cholesterol remains elevated and I would recommend starting a medication called atorvastatin  40mg  daily to help lower her cholesterol - Please let her know this medication is not safe in pregnancy, so if she removes her Nexplanon  she needs another reliable form of birth control to prevent pregnancy or she should stop the medicaiton if she wants to get pregnant - please let me know if she has other questions or concerns.  Rollene Kenn Rekowski MD

## 2024-01-02 ENCOUNTER — Other Ambulatory Visit

## 2024-01-02 DIAGNOSIS — E039 Hypothyroidism, unspecified: Secondary | ICD-10-CM

## 2024-01-03 LAB — TSH RFX ON ABNORMAL TO FREE T4: TSH: 3.41 u[IU]/mL (ref 0.450–4.500)

## 2024-01-05 ENCOUNTER — Telehealth: Payer: Self-pay | Admitting: Family Medicine

## 2024-01-05 NOTE — Telephone Encounter (Signed)
 Left VM discussing normal TSH in range and recommend continuing levothyroxine  75 mcg daily. Left clinic number to call back if questions.  Rollene Keeling MD

## 2024-03-16 ENCOUNTER — Other Ambulatory Visit: Payer: Self-pay | Admitting: Family Medicine

## 2024-03-16 DIAGNOSIS — E038 Other specified hypothyroidism: Secondary | ICD-10-CM

## 2024-03-29 ENCOUNTER — Telehealth: Payer: Self-pay

## 2024-03-29 ENCOUNTER — Other Ambulatory Visit (HOSPITAL_COMMUNITY): Payer: Self-pay

## 2024-03-29 NOTE — Telephone Encounter (Signed)
 Pharmacy Patient Advocate Encounter   Received notification from Patient Pharmacy that prior authorization for [facility] G7 SENSOR is required/requested.   Insurance verification completed.   The patient is insured through [facility] MEDICAID.   PA required; PA submitted to above mentioned insurance via Latent Key/confirmation #/EOC [id number]. Status is pending

## 2024-03-30 NOTE — Telephone Encounter (Signed)
 Pharmacy Patient Advocate Encounter  Received notification from [facility] MEDICAID that Prior Authorization for [facility] G7 SENSOR has been APPROVED from 03/29/24 to 09/25/24   PA #/Case ID/Reference #: [id number]
